# Patient Record
Sex: Female | Born: 1955
Health system: Southern US, Community
[De-identification: ages and names within clinical notes are randomized; demographics above are authoritative.]

## PROBLEM LIST (undated history)

## (undated) DIAGNOSIS — S62109A Fracture of unspecified carpal bone, unspecified wrist, initial encounter for closed fracture: Secondary | ICD-10-CM

## (undated) DIAGNOSIS — G43009 Migraine without aura, not intractable, without status migrainosus: Principal | ICD-10-CM

## (undated) DIAGNOSIS — D499 Neoplasm of unspecified behavior of unspecified site: Secondary | ICD-10-CM

## (undated) DIAGNOSIS — J45909 Unspecified asthma, uncomplicated: Secondary | ICD-10-CM

## (undated) DIAGNOSIS — K589 Irritable bowel syndrome without diarrhea: Secondary | ICD-10-CM

## (undated) DIAGNOSIS — Z8669 Personal history of other diseases of the nervous system and sense organs: Secondary | ICD-10-CM

## (undated) DIAGNOSIS — S82892A Other fracture of left lower leg, initial encounter for closed fracture: Secondary | ICD-10-CM

## (undated) DIAGNOSIS — M1990 Unspecified osteoarthritis, unspecified site: Secondary | ICD-10-CM

## (undated) DIAGNOSIS — M8580 Other specified disorders of bone density and structure, unspecified site: Secondary | ICD-10-CM

## (undated) DIAGNOSIS — S83209A Unspecified tear of unspecified meniscus, current injury, unspecified knee, initial encounter: Secondary | ICD-10-CM

## (undated) HISTORY — DX: Fracture of unspecified carpal bone, unspecified wrist, initial encounter for closed fracture: S62.109A

## (undated) HISTORY — DX: Other fracture of left lower leg, initial encounter for closed fracture: S82.892A

## (undated) HISTORY — DX: Neoplasm of unspecified behavior of unspecified site: D49.9

## (undated) HISTORY — DX: Unspecified asthma, uncomplicated: J45.909

## (undated) HISTORY — DX: Unspecified tear of unspecified meniscus, current injury, unspecified knee, initial encounter: S83.209A

## (undated) HISTORY — PX: CATARACT EXTRACTION: SUR2

## (undated) HISTORY — DX: Unspecified osteoarthritis, unspecified site: M19.90

## (undated) HISTORY — DX: Other specified disorders of bone density and structure, unspecified site: M85.80

## (undated) HISTORY — DX: Personal history of other diseases of the nervous system and sense organs: Z86.69

## (undated) HISTORY — DX: Migraine without aura, not intractable, without status migrainosus: G43.009

---

## 1985-03-31 HISTORY — PX: NOSE SURGERY: SHX723

## 1991-04-01 DIAGNOSIS — J45909 Unspecified asthma, uncomplicated: Secondary | ICD-10-CM

## 1991-04-01 HISTORY — DX: Unspecified asthma, uncomplicated: J45.909

## 1994-12-30 HISTORY — PX: DIAGNOSTIC LAPAROSCOPY: SUR761

## 1995-01-30 HISTORY — PX: VAGINAL HYSTERECTOMY: SUR661

## 1997-09-25 ENCOUNTER — Encounter: Admission: RE | Admit: 1997-09-25 | Discharge: 1997-09-25 | Payer: Self-pay | Admitting: Family Medicine

## 1998-03-01 ENCOUNTER — Encounter: Admission: RE | Admit: 1998-03-01 | Discharge: 1998-03-01 | Payer: Self-pay | Admitting: Sports Medicine

## 1998-04-20 ENCOUNTER — Ambulatory Visit (HOSPITAL_COMMUNITY): Admission: RE | Admit: 1998-04-20 | Discharge: 1998-04-20 | Payer: Self-pay | Admitting: Gastroenterology

## 1998-04-20 ENCOUNTER — Encounter: Payer: Self-pay | Admitting: Gastroenterology

## 1998-04-30 ENCOUNTER — Ambulatory Visit (HOSPITAL_COMMUNITY): Admission: RE | Admit: 1998-04-30 | Discharge: 1998-04-30 | Payer: Self-pay | Admitting: Gastroenterology

## 1998-06-04 ENCOUNTER — Encounter: Payer: Self-pay | Admitting: Emergency Medicine

## 1998-06-04 ENCOUNTER — Emergency Department (HOSPITAL_COMMUNITY): Admission: EM | Admit: 1998-06-04 | Discharge: 1998-06-04 | Payer: Self-pay | Admitting: Emergency Medicine

## 1999-04-01 HISTORY — PX: LIPOSUCTION: SHX10

## 1999-05-02 ENCOUNTER — Encounter: Admission: RE | Admit: 1999-05-02 | Discharge: 1999-05-02 | Payer: Self-pay | Admitting: Sports Medicine

## 1999-05-14 ENCOUNTER — Encounter: Admission: RE | Admit: 1999-05-14 | Discharge: 1999-05-14 | Payer: Self-pay | Admitting: Family Medicine

## 1999-05-20 ENCOUNTER — Encounter: Admission: RE | Admit: 1999-05-20 | Discharge: 1999-05-20 | Payer: Self-pay | Admitting: Family Medicine

## 1999-08-07 ENCOUNTER — Encounter: Admission: RE | Admit: 1999-08-07 | Discharge: 1999-08-07 | Payer: Self-pay | Admitting: Family Medicine

## 1999-08-12 ENCOUNTER — Encounter: Admission: RE | Admit: 1999-08-12 | Discharge: 1999-08-12 | Payer: Self-pay | Admitting: Sports Medicine

## 1999-08-29 ENCOUNTER — Encounter: Admission: RE | Admit: 1999-08-29 | Discharge: 1999-08-29 | Payer: Self-pay | Admitting: Sports Medicine

## 1999-11-18 ENCOUNTER — Ambulatory Visit (HOSPITAL_COMMUNITY): Admission: RE | Admit: 1999-11-18 | Discharge: 1999-11-18 | Payer: Self-pay | Admitting: Gastroenterology

## 2000-08-26 ENCOUNTER — Ambulatory Visit (HOSPITAL_BASED_OUTPATIENT_CLINIC_OR_DEPARTMENT_OTHER): Admission: RE | Admit: 2000-08-26 | Discharge: 2000-08-26 | Payer: Self-pay | Admitting: Plastic Surgery

## 2000-08-27 ENCOUNTER — Encounter: Admission: RE | Admit: 2000-08-27 | Discharge: 2000-08-27 | Payer: Self-pay | Admitting: Sports Medicine

## 2000-10-22 ENCOUNTER — Encounter: Admission: RE | Admit: 2000-10-22 | Discharge: 2000-10-22 | Payer: Self-pay | Admitting: Family Medicine

## 2000-11-26 ENCOUNTER — Encounter: Admission: RE | Admit: 2000-11-26 | Discharge: 2000-11-26 | Payer: Self-pay | Admitting: Sports Medicine

## 2001-11-25 ENCOUNTER — Encounter: Admission: RE | Admit: 2001-11-25 | Discharge: 2001-11-25 | Payer: Self-pay | Admitting: Sports Medicine

## 2001-12-14 ENCOUNTER — Encounter: Admission: RE | Admit: 2001-12-14 | Discharge: 2001-12-14 | Payer: Self-pay | Admitting: Sports Medicine

## 2002-03-03 ENCOUNTER — Encounter: Admission: RE | Admit: 2002-03-03 | Discharge: 2002-03-03 | Payer: Self-pay | Admitting: Family Medicine

## 2003-05-31 ENCOUNTER — Encounter: Admission: RE | Admit: 2003-05-31 | Discharge: 2003-05-31 | Payer: Self-pay | Admitting: Family Medicine

## 2003-06-29 ENCOUNTER — Encounter: Admission: RE | Admit: 2003-06-29 | Discharge: 2003-06-29 | Payer: Self-pay | Admitting: Sports Medicine

## 2003-08-30 ENCOUNTER — Ambulatory Visit (HOSPITAL_COMMUNITY): Admission: RE | Admit: 2003-08-30 | Discharge: 2003-08-30 | Payer: Self-pay | Admitting: Plastic Surgery

## 2003-08-30 ENCOUNTER — Ambulatory Visit (HOSPITAL_BASED_OUTPATIENT_CLINIC_OR_DEPARTMENT_OTHER): Admission: RE | Admit: 2003-08-30 | Discharge: 2003-08-30 | Payer: Self-pay | Admitting: Plastic Surgery

## 2003-08-30 ENCOUNTER — Encounter (INDEPENDENT_AMBULATORY_CARE_PROVIDER_SITE_OTHER): Payer: Self-pay | Admitting: Specialist

## 2004-02-05 ENCOUNTER — Ambulatory Visit: Payer: Self-pay | Admitting: Family Medicine

## 2004-03-21 ENCOUNTER — Ambulatory Visit: Payer: Self-pay | Admitting: Sports Medicine

## 2004-03-21 ENCOUNTER — Ambulatory Visit (HOSPITAL_COMMUNITY): Admission: RE | Admit: 2004-03-21 | Discharge: 2004-03-21 | Payer: Self-pay | Admitting: Sports Medicine

## 2004-10-02 ENCOUNTER — Ambulatory Visit: Payer: Self-pay | Admitting: Family Medicine

## 2004-10-10 ENCOUNTER — Ambulatory Visit: Payer: Self-pay | Admitting: Sports Medicine

## 2005-01-02 ENCOUNTER — Ambulatory Visit: Payer: Self-pay | Admitting: Family Medicine

## 2005-03-31 DIAGNOSIS — S62109A Fracture of unspecified carpal bone, unspecified wrist, initial encounter for closed fracture: Secondary | ICD-10-CM

## 2005-03-31 HISTORY — DX: Fracture of unspecified carpal bone, unspecified wrist, initial encounter for closed fracture: S62.109A

## 2005-03-31 HISTORY — PX: FRACTURE SURGERY: SHX138

## 2005-11-17 ENCOUNTER — Ambulatory Visit (HOSPITAL_COMMUNITY): Admission: RE | Admit: 2005-11-17 | Discharge: 2005-11-17 | Payer: Self-pay | Admitting: Sports Medicine

## 2006-05-28 DIAGNOSIS — G43909 Migraine, unspecified, not intractable, without status migrainosus: Secondary | ICD-10-CM | POA: Insufficient documentation

## 2006-05-28 DIAGNOSIS — J45909 Unspecified asthma, uncomplicated: Secondary | ICD-10-CM | POA: Insufficient documentation

## 2006-05-28 DIAGNOSIS — J309 Allergic rhinitis, unspecified: Secondary | ICD-10-CM | POA: Insufficient documentation

## 2006-05-28 DIAGNOSIS — K449 Diaphragmatic hernia without obstruction or gangrene: Secondary | ICD-10-CM | POA: Insufficient documentation

## 2006-07-08 ENCOUNTER — Encounter: Payer: Self-pay | Admitting: Sports Medicine

## 2006-08-14 ENCOUNTER — Ambulatory Visit (HOSPITAL_COMMUNITY): Admission: RE | Admit: 2006-08-14 | Discharge: 2006-08-14 | Payer: Self-pay | Admitting: Gastroenterology

## 2006-11-03 ENCOUNTER — Ambulatory Visit: Payer: Self-pay | Admitting: Sports Medicine

## 2006-11-03 DIAGNOSIS — M62838 Other muscle spasm: Secondary | ICD-10-CM | POA: Insufficient documentation

## 2006-11-04 ENCOUNTER — Telehealth: Payer: Self-pay | Admitting: Sports Medicine

## 2006-11-05 ENCOUNTER — Ambulatory Visit: Payer: Self-pay | Admitting: Sports Medicine

## 2006-11-05 DIAGNOSIS — T4145XA Adverse effect of unspecified anesthetic, initial encounter: Secondary | ICD-10-CM | POA: Insufficient documentation

## 2006-12-11 ENCOUNTER — Encounter: Payer: Self-pay | Admitting: Sports Medicine

## 2006-12-21 ENCOUNTER — Encounter: Payer: Self-pay | Admitting: Sports Medicine

## 2006-12-31 ENCOUNTER — Encounter: Payer: Self-pay | Admitting: Sports Medicine

## 2007-03-23 ENCOUNTER — Encounter: Payer: Self-pay | Admitting: Sports Medicine

## 2007-04-02 ENCOUNTER — Encounter: Payer: Self-pay | Admitting: Sports Medicine

## 2007-04-29 ENCOUNTER — Ambulatory Visit: Payer: Self-pay | Admitting: Sports Medicine

## 2007-04-30 ENCOUNTER — Encounter: Payer: Self-pay | Admitting: *Deleted

## 2007-05-04 ENCOUNTER — Telehealth: Payer: Self-pay | Admitting: *Deleted

## 2007-05-06 ENCOUNTER — Telehealth: Payer: Self-pay | Admitting: *Deleted

## 2007-05-07 ENCOUNTER — Encounter: Payer: Self-pay | Admitting: Sports Medicine

## 2007-05-11 ENCOUNTER — Ambulatory Visit: Payer: Self-pay | Admitting: Sports Medicine

## 2007-05-19 ENCOUNTER — Ambulatory Visit (HOSPITAL_COMMUNITY): Admission: RE | Admit: 2007-05-19 | Discharge: 2007-05-19 | Payer: Self-pay | Admitting: Orthopedic Surgery

## 2007-05-25 ENCOUNTER — Encounter: Payer: Self-pay | Admitting: Sports Medicine

## 2007-06-04 ENCOUNTER — Encounter: Payer: Self-pay | Admitting: Sports Medicine

## 2007-07-30 ENCOUNTER — Encounter: Payer: Self-pay | Admitting: Sports Medicine

## 2007-09-15 ENCOUNTER — Encounter: Payer: Self-pay | Admitting: Sports Medicine

## 2007-09-28 ENCOUNTER — Encounter: Payer: Self-pay | Admitting: Sports Medicine

## 2007-12-16 ENCOUNTER — Telehealth: Payer: Self-pay | Admitting: *Deleted

## 2008-01-21 ENCOUNTER — Encounter: Payer: Self-pay | Admitting: Sports Medicine

## 2008-04-10 ENCOUNTER — Encounter: Payer: Self-pay | Admitting: Sports Medicine

## 2008-10-30 ENCOUNTER — Ambulatory Visit: Payer: Self-pay | Admitting: Family Medicine

## 2008-10-30 ENCOUNTER — Telehealth (INDEPENDENT_AMBULATORY_CARE_PROVIDER_SITE_OTHER): Payer: Self-pay | Admitting: *Deleted

## 2008-10-30 DIAGNOSIS — J0100 Acute maxillary sinusitis, unspecified: Secondary | ICD-10-CM | POA: Insufficient documentation

## 2008-11-03 ENCOUNTER — Emergency Department (HOSPITAL_COMMUNITY): Admission: EM | Admit: 2008-11-03 | Discharge: 2008-11-03 | Payer: Self-pay | Admitting: Family Medicine

## 2009-05-16 ENCOUNTER — Ambulatory Visit: Payer: Self-pay | Admitting: Family Medicine

## 2009-05-16 ENCOUNTER — Ambulatory Visit: Payer: Self-pay | Admitting: Sports Medicine

## 2009-05-16 DIAGNOSIS — L570 Actinic keratosis: Secondary | ICD-10-CM | POA: Insufficient documentation

## 2009-05-16 DIAGNOSIS — R079 Chest pain, unspecified: Secondary | ICD-10-CM | POA: Insufficient documentation

## 2009-05-16 DIAGNOSIS — D649 Anemia, unspecified: Secondary | ICD-10-CM | POA: Insufficient documentation

## 2009-05-16 LAB — CONVERTED CEMR LAB
ALT: 12 units/L (ref 0–35)
AST: 20 units/L (ref 0–37)
Albumin: 4.4 g/dL (ref 3.5–5.2)
Alkaline Phosphatase: 50 units/L (ref 39–117)
BUN: 16 mg/dL (ref 6–23)
CO2: 24 meq/L (ref 19–32)
Calcium: 9.5 mg/dL (ref 8.4–10.5)
Chloride: 106 meq/L (ref 96–112)
Cholesterol: 224 mg/dL — ABNORMAL HIGH (ref 0–200)
Creatinine, Ser: 0.79 mg/dL (ref 0.40–1.20)
Glucose, Bld: 91 mg/dL (ref 70–99)
HCT: 38.8 % (ref 36.0–46.0)
HDL: 90 mg/dL (ref >39)
Hemoglobin: 12.4 g/dL (ref 12.0–15.0)
LDL Cholesterol: 124 mg/dL — ABNORMAL HIGH (ref 0–99)
MCHC: 32 g/dL (ref 30.0–36.0)
MCV: 92.2 fL (ref 78.0–100.0)
Platelets: 254 10*3/uL (ref 150–400)
Potassium: 4.2 meq/L (ref 3.5–5.3)
RBC: 4.21 M/uL (ref 3.87–5.11)
RDW: 13.5 % (ref 11.5–15.5)
Sodium: 143 meq/L (ref 135–145)
Total Bilirubin: 0.7 mg/dL (ref 0.3–1.2)
Total CHOL/HDL Ratio: 2.5
Total Protein: 6.7 g/dL (ref 6.0–8.3)
Triglycerides: 51 mg/dL (ref <150)
VLDL: 10 mg/dL (ref 0–40)
WBC: 4.6 10*3/uL (ref 4.0–10.5)

## 2009-05-18 ENCOUNTER — Encounter: Payer: Self-pay | Admitting: Sports Medicine

## 2009-05-18 ENCOUNTER — Telehealth: Payer: Self-pay | Admitting: Family Medicine

## 2009-05-25 ENCOUNTER — Encounter: Payer: Self-pay | Admitting: Sports Medicine

## 2009-06-06 ENCOUNTER — Ambulatory Visit: Payer: Self-pay | Admitting: Sports Medicine

## 2009-06-06 ENCOUNTER — Ambulatory Visit (HOSPITAL_COMMUNITY): Admission: RE | Admit: 2009-06-06 | Discharge: 2009-06-06 | Payer: Self-pay | Admitting: Sports Medicine

## 2010-01-21 ENCOUNTER — Ambulatory Visit: Payer: Self-pay | Admitting: Sports Medicine

## 2010-04-30 NOTE — Assessment & Plan Note (Signed)
Summary: CPE,MC   Vital Signs:  Patient profile:   55 year old female Height:      63 inches Weight:      142 pounds BP sitting:   123 / 84  Vitals Entered By: Lillia Pauls CMA (May 16, 2009 10:36 AM)  History of Present Illness: 03/09/2009 Mid chest pain - substernal after busy day not that sharp or painful but uncomfortable continues to come and go gets some SOB but this also comes at other times  Hx of septoplasty on both nostrils now feels poor airflow in RT nostril  GERD - sometimes gets breakthru will use TUMS stays on aciphex  Exercise less consistent since husband CABG she is still walking less workouts less skating  she has appt with Dr Hyacinth Meeker for Pap and breast in 2 weeks Mammogram next week Saw Dr Terri Piedra for cryotherapy and is doing blue light therapy has used Effudex on left temple  Allergies: 1)  ! Fiorinal (Butalbital-Aspirin-Caffeine)  Past History:  Past Medical History: anemia - iron loss,  colles fracture fx Lt 07/1999 colles fx RT in 2009  endometriosis hepatitits A vaccine 09/2004  hx of bad reactions to presurgical medicines with vomiting Hx of migraines - meds can trigger this lumbar injury  asthma - prob EIA with cold exposure chronic siunsitis now S/P surgery  got a nose bleed on flonase has cut back on this  Family History: 1 sisters - 53 with severe asthma 1 sister with RA and Palindromic form - 21  father -21 or so - carotid endarterectomy  hbp/ alcohol PGM lived to 23  mother died crf after ulcerative colitis age 47  Social History: non smoker;  social etoh only;  keeps dogs;  exercises with figure skating  Husband had CABG on Mar 10, 2009  Review of Systems  The patient denies weight loss, weight gain, syncope, dyspnea on exertion, prolonged cough, and abdominal pain.    Physical Exam  General:  Well-developed,well-nourished,in no acute distress; alert,appropriate and cooperative throughout  examination Head:  Normocephalic and atraumatic without obvious abnormalities. No apparent alopecia or balding. Eyes:  glasses Ears:  External ear exam shows no significant lesions or deformities.  Otoscopic examination reveals clear canals, tympanic membranes are intact bilaterally without bulging, retraction, inflammation or discharge. Hearing is grossly normal bilaterally. Nose:  upper nostrils show some limited space with swollen turbinates airflow seems down a bit Rt> LT no polyps noted Mouth:  Oral mucosa and oropharynx without lesions or exudates.  Teeth in good repair. Neck:  No deformities, masses, or tenderness noted.  some limited ROM on rotation and lat bending Chest Wall:  No deformities, masses, or tenderness noted. Lungs:  Normal respiratory effort, chest expands symmetrically. Lungs are clear to auscultation, no crackles or wheezes. Heart:  Normal rate and regular rhythm. S1 and S2 normal without gallop, murmur, click, rub or other extra sounds.  rate is 56 Abdomen:  Bowel sounds positive,abdomen soft and non-tender without masses, organomegaly or hernias noted. Msk:  shoulders, hips, knees, ankles all show full ROM there is slightly dec in IR of left hip vs RT but still wnl no swelling no instability  standing there is slight curve of low back to left normalized with bending forward Pulses:  R and L carotid,radial,femoral,dorsalis pedis and posterior tibial pulses are full and equal bilaterally Extremities:  No clubbing, cyanosis, edema, or deformity noted with normal full range of motion of all joints.     Impression & Recommendations:  Problem #  1:  PREVENTIVE HEALTH CARE (ICD-V70.0) will ck stds for her health CMET Lipid get mammogram next week get pap 2 wks  Problem # 2:  CHEST PAIN (ICD-786.50) This sounds non cardiac With Hx of recurrent and new episodes will plan to do ETT  Problem # 3:  ANEMIA (ICD-285.9) will reck CBC  Problem # 4:  RHINITIS,  ALLERGIC (ICD-477.9)  Her updated medication list for this problem includes:    Zyrtec Allergy 10 Mg Tabs (Cetirizine hcl) .Marland Kitchen... 1 tab by mouth daily    Fluticasone Propionate 50 Mcg/act Susp (Fluticasone propionate) .Marland Kitchen... 1 puff two times a day  suspect her nasal sxs are related to the allergic probs However, will have her see DR Ezzard Standing since she has had septoplasty by DR Lyman Bishop several years back I am not sure whether she is having some occulion in superior area of turbinates again or not but clearly feels worse to her  Problem # 5:  ASTHMA, UNSPECIFIED (ICD-493.90)  Her updated medication list for this problem includes:    Singulair 10 Mg Tabs (Montelukast sodium) .Marland Kitchen... 1 tab by mouth daily    Pulmicort Flexhaler 180 Mcg/act Inha (Budesonide) .Marland Kitchen... 2 puffs two times a day 1 mdi    Albuterol 90 Mcg/act Aers (Albuterol) .Marland Kitchen... 1 puff q4 as needed  no change as stable with this  Problem # 6:  HERNIA, HIATAL, NONCONGENITAL (ICD-553.3) cont on aciphex may be source of chest pain but not clearly so  Complete Medication List: 1)  Aciphex 20 Mg Tbec (Rabeprazole sodium) .... Take 1 tablet by mouth once a day 2)  Ambien 10 Mg Tabs (Zolpidem tartrate) .Marland Kitchen.. 1 by mouth @hs  prn 3)  Singulair 10 Mg Tabs (Montelukast sodium) .Marland Kitchen.. 1 tab by mouth daily 4)  Zyrtec Allergy 10 Mg Tabs (Cetirizine hcl) .Marland Kitchen.. 1 tab by mouth daily 5)  Pulmicort Flexhaler 180 Mcg/act Inha (Budesonide) .... 2 puffs two times a day 1 mdi 6)  Albuterol 90 Mcg/act Aers (Albuterol) .Marland Kitchen.. 1 puff q4 as needed 7)  Fluticasone Propionate 50 Mcg/act Susp (Fluticasone propionate) .Marland Kitchen.. 1 puff bid  Patient Instructions: 1)  appt with dr Ezzard Standing is on 05/18/09 at 1:30pm. address is [address] [phone] 2)  ETT is scheduled for 06/06/09 at 11:15am at [facility]

## 2010-04-30 NOTE — Consult Note (Signed)
Summary: [facility],Nose [facility] Th[facility] Head [facility] Neck Surgery  Ear,Nose & Throat Head & Neck Surgery   Imported By: Clydell Hakim 05/23/2009 15:55:56  _____________________________________________________________________  External Attachment:    Type:   Image     Comment:   External Document

## 2010-04-30 NOTE — Assessment & Plan Note (Signed)
Summary: BREATHING ISSUES,MC   Vital Signs:  Patient profile:   55 year old female Pulse rate:   66 / minute BP sitting:   123 / 77  (right arm)  Vitals Entered By: Rochele Pages RN (January 21, 2010 11:03 AM) CC: has been off inhalers- feels that nasal passages are "blocked"   CC:  has been off inhalers- feels that nasal passages are "blocked".  History of Present Illness: SOB: with exercise.  Ran out of inhaillers several months ago.   Was doing OK until started to cross train for ice skating this Novemeber.  Has wheezing and SOB with exercise now.  also gets sxs with vigorous exercise on equipment or with pilates  very sensitive to inhaled cigarettes, odors, chemicals and these trigger SOB as well  Current Problems (verified): 1)  Actinic Keratosis  (ICD-702.0) 2)  Chest Pain  (ICD-786.50) 3)  Encounter For Long-term Use of Other Medications  (ICD-V58.69) 4)  Anemia  (ICD-285.9) 5)  Screening For Lipoid Disorders  (ICD-V77.91) 6)  Preventive Health Care  (ICD-V70.0) 7)  Acute Maxillary Sinusitis  (ICD-461.0) 8)  Advef, Drug/med/biol Subst, Anesthesia  (ICD-995.22) 9)  Muscle Spasm, Trapezius Muscle, Right  (ICD-728.85) 10)  Rhinitis, Allergic  (ICD-477.9) 11)  Migraine, Unspec., w/o Intractable Migraine  (ICD-346.90) 12)  Hernia, Hiatal, Noncongenital  (ICD-553.3) 13)  Asthma, Unspecified  (ICD-493.90)  Current Medications (verified): 1)  Aciphex 20 Mg Tbec (Rabeprazole Sodium) .... Take 1 Tablet By Mouth Once A Day 2)  Ambien 10 Mg Tabs (Zolpidem Tartrate) .Marland Kitchen.. 1 By Mouth @hs  Prn 3)  Singulair 10 Mg  Tabs (Montelukast Sodium) .Marland Kitchen.. 1 Tab By Mouth Daily 4)  Zyrtec Allergy 10 Mg  Tabs (Cetirizine Hcl) .Marland Kitchen.. 1 Tab By Mouth Daily 5)  Qvar 40 Mcg/act Aers (Beclomethasone Dipropionate) .... 2 Puffs Inhailled Two Times A Day With Spacer 6)  Ventolin Hfa 108 (90 Base) Mcg/act Aers (Albuterol Sulfate) .Marland Kitchen.. 1-2 Puffs 15 Mins Prior To Exercise or Q4s As Needed Shortness of Breath.  Use With Spacer 7)  Fluticasone Propionate 50 Mcg/act  Susp (Fluticasone Propionate) .Marland Kitchen.. 1 Puff Bid 8)  Aerochamber Mv  Misc (Spacer/aero-Holding Chambers) .Marland Kitchen.. 1  Allergies (verified): 1)  ! Fiorinal (Butalbital-Aspirin-Caffeine)  Past History:  Past Medical History: Last updated: 05/16/2009 anemia - iron loss,  colles fracture fx Lt 07/1999 colles fx RT in 2009  endometriosis hepatitits A vaccine 09/2004  hx of bad reactions to presurgical medicines with vomiting Hx of migraines - meds can trigger this lumbar injury  asthma - prob EIA with cold exposure chronic siunsitis now S/P surgery  got a nose bleed on flonase has cut back on this  Social History: Last updated: 05/16/2009 non smoker;  social etoh only;  keeps dogs;  exercises with figure skating  Husband had CABG on Mar 10, 2009  Review of Systems  The patient denies anorexia, fever, weight loss, chest pain, syncope, and abdominal pain.    Physical Exam  General:  VS noted.  Well NAD Nose:  upper nostrils show some limited space with swollen turbinates airflow seems down a bit Rt> LT no polyps noted Lungs:  Normal respiratory effort, chest expands symmetrically. Lungs are clear to auscultation, no crackles or wheezes. Heart:  Normal rate and regular rhythm. S1 and S2 normal without gallop, murmur, click, rub or other extra sounds.    Impression & Recommendations:  Problem # 1:  ASTHMA, UNSPECIFIED (ICD-493.90) Assessment Unchanged exercise induced asthma.  Plan to resume ICS. Did not like  the pilmicort device. Plan QVAR 40 2 puffs two times a day with spacer. Also albuterol prior to exercise.  Also gave RX for new spacer and flonase nasal spray. Will follow up as needed or in 1 year.  Red flags disucssed. pt voices understanding.   Her updated medication list for this problem includes:    Singulair 10 Mg Tabs (Montelukast sodium) .Marland Kitchen... 1 tab by mouth daily    Qvar 40 Mcg/act Aers (Beclomethasone  dipropionate) .Marland Kitchen... 2 puffs inhailled two times a day with spacer    Ventolin Hfa 108 (90 Base) Mcg/act Aers (Albuterol sulfate) .Marland Kitchen... 1-2 puffs 15 mins prior to exercise or q4s as needed shortness of breath. use with spacer  Complete Medication List: 1)  Aciphex 20 Mg Tbec (Rabeprazole sodium) .... Take 1 tablet by mouth once a day 2)  Ambien 10 Mg Tabs (Zolpidem tartrate) .Marland Kitchen.. 1 by mouth @hs  prn 3)  Singulair 10 Mg Tabs (Montelukast sodium) .Marland Kitchen.. 1 tab by mouth daily 4)  Zyrtec Allergy 10 Mg Tabs (Cetirizine hcl) .Marland Kitchen.. 1 tab by mouth daily 5)  Qvar 40 Mcg/act Aers (Beclomethasone dipropionate) .... 2 puffs inhailled two times a day with spacer 6)  Ventolin Hfa 108 (90 Base) Mcg/act Aers (Albuterol sulfate) .Marland Kitchen.. 1-2 puffs 15 mins prior to exercise or q4s as needed shortness of breath. use with spacer 7)  Fluticasone Propionate 50 Mcg/act Susp (Fluticasone propionate) .Marland Kitchen.. 1 puff bid 8)  Aerochamber Mv Misc (Spacer/aero-holding chambers) .Marland Kitchen.. 1  Patient Instructions: 1)  Thank you for seeing me today. 2)  Please use the QVAR 2 puffs twice a day with a spacer. This will help prevent asthma. 3)  Use the albuterol 15 mins prior to exercise. 4)  Use these with a spacer. 5)  Try the flonase nasal spray. 6)  Follow up as needed or in 1 year. Prescriptions: FLUTICASONE PROPIONATE 50 MCG/ACT  SUSP (FLUTICASONE PROPIONATE) 1 puff bid  #1 x 11   Entered by:   Clementeen Graham MD   Authorized by:   Enid Baas MD   Signed by:   Clementeen Graham MD on 01/21/2010   Method used:   Electronically to        [facility]* (retail)       [address]       [address] Shipping/mailing       [place], [place]  [address]       Ph: [phone]       Fax: [phone]   RxID:   [id number] AEROCHAMBER MV  MISC (SPACER/AERO-HOLDING CHAMBERS) 1  #1 x 0   Entered by:   Clementeen Graham MD   Authorized by:   Enid Baas MD   Signed by:   Clementeen Graham MD on 01/21/2010   Method used:   Electronically to         [facility]* (retail)       [address]       [address] Shipping/mailing       [place], [place]  [address]       Ph: [phone]       Fax: [phone]   RxID:   [id number] VENTOLIN HFA 108 (90 BASE) MCG/ACT AERS (ALBUTEROL SULFATE) 1-2 puffs 15 mins prior to exercise or q4s as needed shortness of breath. Use with spacer  #1 x 11   Entered by:   Clementeen Graham MD   Authorized by:   Enid Baas MD  Signed by:   Clementeen Graham MD on 01/21/2010   Method used:   Electronically to        [facility]* (retail)       [address]       [address] [place] [address]       [place], [place]  [address]       Ph: [phone]       Fax: [phone]   RxID:   [id number] QVAR 40 MCG/ACT AERS (BECLOMETHASONE DIPROPIONATE) 2 puffs inhailled two times a day with spacer  #1 x 11   Entered by:   Clementeen Graham MD   Authorized by:   Enid Baas MD   Signed by:   Clementeen Graham MD on 01/21/2010   Method used:   Electronically to        [facility]* (retail)       [address]       [address] Shipping/mailing       [place], [place]  [address]       Ph: [phone]       Fax: [phone]   RxID:   [id number]    Orders Added: 1)  Est. Patient Level III [[address]]

## 2010-04-30 NOTE — Letter (Signed)
Summary: *Referral Letter  [facility]  [address]   [place], [place] [address]   Phone: [phone]  Fax: [phone]    05/16/2009 Narda Bonds, MD ENT Fax: [phone]  Dear [name]:  Thank you in advance for agreeing to see my patient:  Susan Crosby [address] [place], [place]  [address]  Phone: [phone]  Reason for Referral: Quinesha is wife of Aniela Caniglia and has been my patient for years.  At least 10 years ago Dr Lyman Bishop did a septoplasty which helped her breathing a lot.  More recently she is feeling more limitation of air flow again and wonders if there is so new occlusion. She does have long standing allergic rhinitis but I am unsure if this is the cause of symptoms based on exam.  Procedures Requested: Your evaluations and suggestions as to care.  Current Medical Problems: 1)  BACK PAIN, WITH RADIATION, UNSPEC. (ICD-724.4) 2)  ACTINIC KERATOSIS (ICD-702.0) 3)  CHEST PAIN (ICD-786.50) 4)  ENCOUNTER FOR LONG-TERM USE OF OTHER MEDICATIONS (ICD-V58.69) 5)  ANEMIA (ICD-285.9) 6)  SCREENING FOR LIPOID DISORDERS (ICD-V77.91) 7)  PREVENTIVE HEALTH CARE (ICD-V70.0) 8)  ACUTE MAXILLARY SINUSITIS (ICD-461.0) 9)  ADVEF, DRUG/MED/BIOL SUBST, ANESTHESIA (ICD-995.22) 10)  MUSCLE SPASM, TRAPEZIUS MUSCLE, RIGHT (ICD-728.85) 11)  RHINITIS, ALLERGIC (ICD-477.9) 12)  MIGRAINE, UNSPEC., W/O INTRACTABLE MIGRAINE (ICD-346.90) 13)  HERNIA, HIATAL, NONCONGENITAL (ICD-553.3) 14)  ASTHMA, UNSPECIFIED (ICD-493.90)   Current Medications: 1)  ACIPHEX 20 MG TBEC (RABEPRAZOLE SODIUM) Take 1 tablet by mouth once a day 2)  AMBIEN 10 MG TABS (ZOLPIDEM TARTRATE) 1 by mouth @hs  prn 3)  SINGULAIR 10 MG  TABS (MONTELUKAST SODIUM) 1 tab by mouth daily 4)  ZYRTEC ALLERGY 10 MG  TABS (CETIRIZINE HCL) 1 tab by mouth daily 5)  PULMICORT FLEXHALER 180 MCG/ACT  INHA (BUDESONIDE) 2 puffs two times a day 1 MDI 6)  ALBUTEROL 90 MCG/ACT  AERS (ALBUTEROL) 1 puff q4 as needed 7)   FLUTICASONE PROPIONATE 50 MCG/ACT  SUSP (FLUTICASONE PROPIONATE) 1 puff bid   Past Medical History: 1)  anemia - iron loss,  2)  colles fracture fx Lt 07/1999 3)  colles fx RT in 2009 4)  endometriosis 5)  hepatitits A vaccine 09/2004 6)  hx of bad reactions to presurgical medicines with vomiting 7)  Hx of migraines - meds can trigger this 8)  lumbar injury 9)  asthma - prob EIA with cold exposure 10)  chronic siunsitis now S/P surgery 11)  got a nose bleed on flonase 12)  has cut back on this   Thank you again for agreeing to see our patient; please contact us if you have any further questions or need additional information.  Sincerely,  Vincent Gros MD

## 2010-04-30 NOTE — Assessment & Plan Note (Signed)
Summary: ETT 11:15 APPT ATY CP,NM   History of Present Illness: ETT Sxs are atypical chest pain/ also feels SOB at times  Bruce protocol 9 mins no st t wave change excellent HRR 27 beats 1 min  Neg Adeq ETT Only AVG fitness level and est Vo2 max of 35 for athletic female  Note that she had a marked jump in HR at shift between stage 2 and stage 3 this seems to be pulmonary as she feels difficulty getting air through nose  patient advised  Allergies: 1)  ! Fiorinal (Butalbital-Aspirin-Caffeine)   Impression & Recommendations:  Problem # 1:  CHEST PAIN (ICD-786.50) see ETT  Complete Medication List: 1)  Aciphex 20 Mg Tbec (Rabeprazole sodium) .... Take 1 tablet by mouth once a day 2)  Ambien 10 Mg Tabs (Zolpidem tartrate) .Marland Kitchen.. 1 by mouth @hs  prn 3)  Singulair 10 Mg Tabs (Montelukast sodium) .Marland Kitchen.. 1 tab by mouth daily 4)  Zyrtec Allergy 10 Mg Tabs (Cetirizine hcl) .Marland Kitchen.. 1 tab by mouth daily 5)  Pulmicort Flexhaler 180 Mcg/act Inha (Budesonide) .... 2 puffs two times a day 1 mdi 6)  Albuterol 90 Mcg/act Aers (Albuterol) .Marland Kitchen.. 1 puff q4 as needed 7)  Fluticasone Propionate 50 Mcg/act Susp (Fluticasone propionate) .Marland Kitchen.. 1 puff bid

## 2010-04-30 NOTE — Progress Notes (Signed)
Summary: Lab results given to pt  ---- Converted from flag ---- ---- 05/17/2009 3:40 PM, Enid Baas MD wrote: let her know that labs are good.  cholesterol runs a little high but her HDL is 90 which lowers her risk ------------------------------  Phone Note Outgoing Call   Call placed by: Terese Door,  May 18, 2009 11:40 AM Call placed to: Patient Summary of Call: Left message for pt to return call in regards to lab results.    Follow-up for Phone Call       Follow-up by: Terese Door,  May 18, 2009 11:40 AM    Additional Follow-up for Phone Call Additional follow up Details #2::    call from pt requesting labs reports gave pt results of lipid profile, explained that total chol in the borderline level but HDL very good and offsets the slightly elevated LDL, gave pt actual numbers and ranges that she requested.  other labs normal - glucose normal  Follow-up by: Bonnie [place],  May 18, 2009 4:04 PM

## 2010-07-07 LAB — POCT I-STAT, CHEM 8
BUN: 20 mg/dL (ref 6–23)
Calcium, Ion: 1.19 mmol/L (ref 1.12–1.32)
Chloride: 106 meq/L (ref 96–112)
Creatinine, Ser: 0.9 mg/dL (ref 0.4–1.2)
Glucose, Bld: 96 mg/dL (ref 70–99)
HCT: 42 % (ref 36.0–46.0)
Hemoglobin: 14.3 g/dL (ref 12.0–15.0)
Potassium: 4.3 meq/L (ref 3.5–5.1)
Sodium: 141 meq/L (ref 135–145)
TCO2: 27 mmol/L (ref 0–100)

## 2010-08-13 NOTE — Op Note (Signed)
Susan Crosby, Susan Crosby                 ACCOUNT NO.:  [id number]   MEDICAL RECORD NO.:  [id number]          PATIENT TYPE:  AMB   LOCATION:  ENDO                         FACILITY:  [facility]   PHYSICIAN:  Bernette Redbird, M.D.   DATE OF BIRTH:  [birth year]   DATE OF PROCEDURE:  08/14/2006  DATE OF DISCHARGE:                               OPERATIVE REPORT   PROCEDURE:  Colonoscopy.   INDICATIONS FOR PROCEDURE:  This is a 55 year old female with the  history of IBS, last colonoscope 7 years ago, and a family history of  colon cancer in her uncle.   FINDINGS:  Normal exam to the terminal ileum.   PROCEDURE:  The nature, purpose, and risks of the procedure were  familiar to the patient from prior examination.  She provided written  consent.  Sedation was fentanyl 100 mcg, Versed 7 mg, and Phenergan 12.5  mg IV without clinical instability.  The Pentax pediatric video  colonoscope was readily advanced to the terminal ileum which had normal  appearance, and pullback was then performed.  The appendiceal orifice  was also identified.  The quality of prep was excellent, and it is felt  that all areas were well seen.   This was a normal examination.  No polyps, cancer, colitis, vascular  malformations, or diverticular disease were observed.  There could have  been a few scattered tics in the sigmoid region, but no discrete  diverticular orifices were seen.  Retroflexion in the rectum was  unremarkable.  No biopsies were obtained.  The patient tolerated the  procedure well, and there were no apparent complications.   IMPRESSION:  Normal screening colonoscopy.   PLAN:  Consider repeat colonoscopy in 10 years or sooner if desired by  the patient in view of the family history in the second-degree relative.           ______________________________  Bernette Redbird, M.D.     RB/MEDQ  D:  08/14/2006  T:  08/14/2006  Job:  [id number]   cc:   Royal Hawthorn B. Darrick Penna, M.D.

## 2010-08-16 NOTE — Op Note (Signed)
NAME:  Susan Crosby, Susan Crosby                           ACCOUNT NO.:  [id number]   MEDICAL RECORD NO.:  [id number]                   PATIENT TYPE:  AMB   LOCATION:  DSC                                  FACILITY:  [facility]   PHYSICIAN:  Alfredia Ferguson, M.D.               DATE OF BIRTH:  [birth year]   DATE OF PROCEDURE:  08/30/2003  DATE OF DISCHARGE:                                 OPERATIVE REPORT   PREOPERATIVE DIAGNOSES:  1. A 1 cm x 5 mm scar left frontal scalp with alopecia.  2. Pigmented nevus, right antecubital fossa, 3 mm.  3. Pigmented nevus, left upper quadrant of the abdomen, 5 mm  4. Pigmented nevus, left anterior thigh, 3 mm.  5. Pigmented nevus, right popliteal area, 3 mm.   POSTOPERATIVE DIAGNOSES:  1. A 1 cm x 5 mm scar left frontal scalp with alopecia.  2. Pigmented nevus, right antecubital fossa, 3 mm.  3. Pigmented nevus, left upper quadrant of the abdomen, 5 mm  4. Pigmented nevus, left anterior thigh, 3 mm.  5. Pigmented nevus, right popliteal area, 3 mm.   OPERATIONS PERFORMED:  1. Excision of pigmented nevus x4.  2. Scar revision, left frontal scalp.   SURGEON:  Alfredia Ferguson, M.D.   ANESTHESIA:  Xylocaine 2% 1:100,000 epinephrine.   INDICATIONS FOR SURGERY:  This is a 55 year old woman who had surgery on her  scalp a couple of years ago.  She was left with some alopecia in the area of  the scar.  She wishes to have this scar revised.  She also had multiple  pigmented nevi, which she would like to have removed.  She understands she  is trading each of these pigmented nevi for a permanent potentially  unsightly scar.  She understands that if any of them returns with positive  margins and they are malignant, then she will need further excision.   DESCRIPTION OF SURGERIES:  Skin marks were placed around all of the above  described five areas, and local anesthesia was infiltrated.  Using a 3 mm  punch, the lesion in the right antecubital area, along with the left  anterior thigh and the right popliteal area were punched out.  Each of these  lesions were closed with an interrupted 5-0 nylon suture.  The lesion in the  left upper quadrant was excised in elliptical fashion down to the level of  subcutaneous tissue.  The incision was closed with multiple interrupted 5-0  nylon sutures.  All specimens were passed off for pathology.   The scalp was prepped with Betadine and draped in sterile drapes.  An  elliptical excision of the scar was carried out.  The edges were undermined  for a distance of several millimeters in all directions.  Hemostasis was  accomplished using pressure.  The scalp edges were united with multiple  interrupted 4-0 nylon suture.   Light dressings were  applied to the lesions in the extremities and trunk.  No dressing was required in the scalp.  The patient was discharged home in  satisfactory condition.                                               Alfredia Ferguson, M.D.    WBB/MEDQ  D:  08/30/2003  T:  08/30/2003  Job:  [id number]

## 2010-08-16 NOTE — Procedures (Signed)
[place]  Patient:    PEYSON, DELAO                        MRN: [id number] Proc. Date: 11/18/99 Adm. Date:  [id number] Attending:  Rich Brave CC:         Sibyl Parr. Darrick Penna, M.D.                           Procedure Report  PROCEDURE:  Colonoscopy with biopsy.  INDICATIONS:  A 55 year old with significant recent change in bowel habits, primarily, towards diarrhea.  FINDINGS:  Normal exam to the cecum.  PROCEDURE:  The nature, purpose and risks of the procedure had been discussed with the patient and she provided written consent.  Sedation was fentanyl 100 mcg and Versed 12.5 mg IV without arrhythmias or desaturation.   The Olympus PCF 160-L adjustable tension pediatric video colonoscope was advanced to the cecum without too much difficulty, applying some external abdominal compression to help overcome looping.  The cecum was identified by visualization of the appendiceal orifice and pull-back was then performed. The terminal ileum was not entered.  The quality of the prep was excellent and it is felt that all areas were well-seen.  This was a normal examination.  No polyps, cancer, clots, vascular malformations or diverticular disease were observed.  Retroflex in the rectum was unremarkable.  Random mucosal biopsies were obtained along the length of the colon.  The patient tolerated the procedure well and there were no apparent complications.  IMPRESSION:  Normal colonoscopy.  PLAN:  Await pathology on random mucosal biopsies. DD:  11/18/99 TD:  11/18/99 Job: [id number]

## 2010-08-16 NOTE — Op Note (Signed)
. [facility]  Patient:    Susan Crosby, Susan Crosby                        MRN: [id number] Proc. Date: 08/26/00 Adm. Date:  [id number] Disc. Date: [id number] Attending:  Loura Halt Ii                           Operative Report  PREOPERATIVE DIAGNOSIS: 1. A 3 mm scaly lesion, left medial thigh. 2. A 3 mm scaly lesion, right popliteal area.  POSTOPERATIVE DIAGNOSIS: 1. A 3 mm scaly lesion, left medial thigh. 2. A 3 mm scaly lesion, right popliteal area.  OPERATION PERFORMED:  Excision of lesions x 2.  SURGEON:  Alfredia Ferguson, M.D.  ANESTHESIA:  2% Xylocaine with 1:100,000 epinephrine.  INDICATIONS FOR PROCEDURE:  The patient is a 55 year old woman with two scaly lesions which have been present and showing no sign of going away.  She wishes to have them removed.  She understands she will be trading what she has for a permanent and potentially unsightly scar.  In spite of that, the patient wishes to proceed with the surgery.  DESCRIPTION OF PROCEDURE:  Skin markers were placed around the lesion on the left medial thigh and local anesthesia was infiltrated.  Similar marks were placed around the popliteal area and infiltrated with local anesthesia.  The left medial thigh was prepped and draped in sterile fashion.  After waiting approximately 10 minutes, an elliptical excision of the lesion was carried out.  The lesion was passed off for pathology.  The wound was closed by approximating the dermis using interrupted 5-0 Vicryl suture.  The skin was united using a running 6-0 nylon suture.  Attention was directed to the right popliteal area where an identical procedure was performed.  Light dressings were applied to both areas and the patient was discharged home  in satisfactory condition. DD:  09/30/00 TD:  09/30/00 Job: [id number]

## 2011-04-06 DIAGNOSIS — S82892A Other fracture of left lower leg, initial encounter for closed fracture: Secondary | ICD-10-CM

## 2011-04-06 HISTORY — DX: Other fracture of left lower leg, initial encounter for closed fracture: S82.892A

## 2011-08-27 ENCOUNTER — Other Ambulatory Visit: Payer: Self-pay | Admitting: Sports Medicine

## 2011-09-01 ENCOUNTER — Other Ambulatory Visit: Payer: Self-pay | Admitting: *Deleted

## 2011-09-01 MED ORDER — MONTELUKAST SODIUM 10 MG PO TABS: 10.0000 mg | ORAL_TABLET | Freq: Every day | ORAL | Status: DC

## 2012-09-10 ENCOUNTER — Ambulatory Visit: Payer: Self-pay | Admitting: Obstetrics & Gynecology

## 2012-11-17 ENCOUNTER — Encounter: Payer: Self-pay | Admitting: Obstetrics & Gynecology

## 2012-11-18 ENCOUNTER — Ambulatory Visit (INDEPENDENT_AMBULATORY_CARE_PROVIDER_SITE_OTHER): Payer: 59 | Admitting: Obstetrics & Gynecology

## 2012-11-18 ENCOUNTER — Encounter: Payer: Self-pay | Admitting: Obstetrics & Gynecology

## 2012-11-18 VITALS — BP 130/84 | HR 60 | Resp 16 | Ht 63.0 in | Wt 142.2 lb

## 2012-11-18 DIAGNOSIS — Z01419 Encounter for gynecological examination (general) (routine) without abnormal findings: Secondary | ICD-10-CM

## 2012-11-18 NOTE — Progress Notes (Signed)
57 y.o. G0P0000 MarriedCaucasianF here for annual exam.  Went to [place] last summer.  Hiked a lot in [place] and in [place].  Saw Dr. Thurston Hole had x rays showing spurs in knees.  Not really ice skating very much because of the pain.  Using celebrex.  Going to hike in [place] and [place] and will hike while they are there.    No vaginal bleeding.  Saw Dr. Kevan Ny every six months.    Patient's last menstrual period was 03/31/1994.          Sexually active: no  The current method of family planning is status post hysterectomy.    Exercising: yes  walking and pilates gyrotonic Smoker:  no  Health Maintenance: Pap:  11/20/99 WNL History of abnormal Pap:  no MMG:  8/18 or 8/19 at [facility] Colonoscopy:  2013 repeat in 5 years, Dr. Warrick Parisian BMD:   08/16/10 -1.4/-0/8 TDaP:  5/13 Screening Labs: PCP, Hb today: PCP, Urine today: PCP   reports that she has never smoked. She has never used smokeless tobacco. She reports that  drinks alcohol. She reports that she does not use illicit drugs.  Past Medical History  Diagnosis Date  . Asthma 1993  . History of migraine headaches   . History of narcotic addiction 01/2007  . Wrist fracture 2007    pisiform     x3  . Arthritis     in neck    Past Surgical History  Procedure Laterality Date  . Vaginal hysterectomy  11/96    Dysmenorrhea-TVH  . Nose surgery  1987  . Diagnostic laparoscopy  10/96    Endometriosis  . Fracture surgery Left 2000    ulner/radius  . Liposuction  2001    Current Outpatient Prescriptions  Medication Sig Dispense Refill  . albuterol (VENTOLIN HFA) 108 (90 BASE) MCG/ACT inhaler Inhale 1-2 puffs into the lungs. 15 min prior to exercise of every 4 hours as needed.  Use with spacer.       . Celecoxib (CELEBREX PO) Take by mouth as needed.      . cetirizine (ZYRTEC) 10 MG tablet Take 10 mg by mouth daily.       . Cholecalciferol (VITAMIN D PO) Take 800 Int'l Units by mouth. Twice daily-gel tabs      . fluticasone (FLONASE)  50 MCG/ACT nasal spray 2 sprays by Nasal route 2 (two) times daily.        . montelukast (SINGULAIR) 10 MG tablet Take 1 tablet (10 mg total) by mouth daily.  90 tablet  3  . RABEprazole (ACIPHEX) 20 MG tablet Take 20 mg by mouth daily.        . SUMAtriptan Succinate (IMITREX PO) Take by mouth as needed.      . beclomethasone (QVAR) 40 MCG/ACT inhaler Inhale 2 puffs into the lungs 2 (two) times daily. With spacer.       Marland Kitchen Spacer/Aero-Holding Chambers (AEROCHAMBER MV) inhaler Use as instructed        No current facility-administered medications for this visit.    Family History  Problem Relation Age of Onset  . Asthma Sister     2 years younger  . Diabetes Sister     2 years younger  . Hypertension Father   . Rheum arthritis Sister     4 years younger  . Crohn's disease Maternal Aunt   . Kidney failure Mother     complications from surgery  . Colitis Mother     ROS:  Pertinent  items are noted in HPI.  Otherwise, a comprehensive ROS was negative.  Exam:   BP 130/84  Pulse 60  Resp 16  Ht 5\' 3"  (1.6 m)  Wt 142 lb 3.2 oz (64.501 kg)  BMI 25.2 kg/m2  LMP 03/31/1994  Weight change: -4lbs   Height: 5\' 3"  (160 cm)  Ht Readings from Last 3 Encounters:  11/18/12 5\' 3"  (1.6 m)  05/16/09 5\' 3"  (1.6 m)  10/30/08 5\' 3"  (1.6 m)    General appearance: alert, cooperative and appears stated age Head: Normocephalic, without obvious abnormality, atraumatic Neck: no adenopathy, supple, symmetrical, trachea midline and thyroid normal to inspection and palpation Lungs: clear to auscultation bilaterally Breasts: normal appearance, no masses or tenderness Heart: regular rate and rhythm Abdomen: soft, non-tender; bowel sounds normal; no masses,  no organomegaly Extremities: extremities normal, atraumatic, no cyanosis or edema Skin: Skin color, texture, turgor normal. No rashes or lesions Lymph nodes: Cervical, supraclavicular, and axillary nodes normal. No abnormal inguinal nodes  palpated Neurologic: Grossly normal   Pelvic: External genitalia:  no lesions              Urethra:  normal appearing urethra with no masses, tenderness or lesions              Bartholins and Skenes: normal                 Vagina: normal appearing vagina with normal color and discharge, no lesions              Cervix: absent              Pap taken: no Bimanual Exam:  Uterus:  uterus absent              Adnexa: normal adnexa and no mass, fullness, tenderness               Rectovaginal: Confirms               Anus:  normal sphincter tone, no lesions  A:  Well Woman with normal exam H/O TVH 1996 PMP, no HRT Recent issues with left knee  P:   Mammogram yearly. pap smear not indicated today.   Labs with PCP. return annually or prn  An After Visit Summary was printed and given to the patient.

## 2012-11-18 NOTE — Patient Instructions (Signed)

## 2012-12-01 ENCOUNTER — Other Ambulatory Visit: Payer: Self-pay | Admitting: Neurology

## 2013-01-11 ENCOUNTER — Encounter (HOSPITAL_COMMUNITY): Payer: Self-pay | Admitting: Emergency Medicine

## 2013-01-11 ENCOUNTER — Emergency Department (HOSPITAL_COMMUNITY)
Admission: EM | Admit: 2013-01-11 | Discharge: 2013-01-11 | Disposition: A | Discharge status: Home / Self Care | Payer: 59 | Attending: Emergency Medicine | Admitting: Emergency Medicine

## 2013-01-11 DIAGNOSIS — N390 Urinary tract infection, site not specified: Secondary | ICD-10-CM | POA: Insufficient documentation

## 2013-01-11 DIAGNOSIS — K589 Irritable bowel syndrome without diarrhea: Secondary | ICD-10-CM | POA: Insufficient documentation

## 2013-01-11 DIAGNOSIS — R110 Nausea: Secondary | ICD-10-CM | POA: Insufficient documentation

## 2013-01-11 DIAGNOSIS — Z79899 Other long term (current) drug therapy: Secondary | ICD-10-CM | POA: Insufficient documentation

## 2013-01-11 DIAGNOSIS — R1084 Generalized abdominal pain: Secondary | ICD-10-CM | POA: Insufficient documentation

## 2013-01-11 DIAGNOSIS — N3000 Acute cystitis without hematuria: Secondary | ICD-10-CM | POA: Insufficient documentation

## 2013-01-11 DIAGNOSIS — R109 Unspecified abdominal pain: Secondary | ICD-10-CM

## 2013-01-11 DIAGNOSIS — R197 Diarrhea, unspecified: Secondary | ICD-10-CM | POA: Insufficient documentation

## 2013-01-11 DIAGNOSIS — N3090 Cystitis, unspecified without hematuria: Secondary | ICD-10-CM

## 2013-01-11 DIAGNOSIS — J45909 Unspecified asthma, uncomplicated: Secondary | ICD-10-CM | POA: Insufficient documentation

## 2013-01-11 DIAGNOSIS — Z885 Allergy status to narcotic agent status: Secondary | ICD-10-CM | POA: Insufficient documentation

## 2013-01-11 DIAGNOSIS — Z8669 Personal history of other diseases of the nervous system and sense organs: Secondary | ICD-10-CM | POA: Insufficient documentation

## 2013-01-11 DIAGNOSIS — R630 Anorexia: Secondary | ICD-10-CM | POA: Insufficient documentation

## 2013-01-11 DIAGNOSIS — R509 Fever, unspecified: Secondary | ICD-10-CM | POA: Insufficient documentation

## 2013-01-11 DIAGNOSIS — M129 Arthropathy, unspecified: Secondary | ICD-10-CM | POA: Insufficient documentation

## 2013-01-11 HISTORY — DX: Irritable bowel syndrome, unspecified: K58.9

## 2013-01-11 LAB — CBC
Platelets: 185 10*3/uL (ref 150–400)
RBC: 3.84 MIL/uL — ABNORMAL LOW (ref 3.87–5.11)
WBC: 7.6 10*3/uL (ref 4.0–10.5)

## 2013-01-11 LAB — COMPREHENSIVE METABOLIC PANEL
ALT: 9 U/L (ref 0–35)
Albumin: 3.6 g/dL (ref 3.5–5.2)
Alkaline Phosphatase: 55 U/L (ref 39–117)
Calcium: 9 mg/dL (ref 8.4–10.5)
Chloride: 103 mEq/L (ref 96–112)
Creatinine, Ser: 0.79 mg/dL (ref 0.50–1.10)
GFR calc Af Amer: >90 mL/min (ref >90)
GFR calc non Af Amer: 89 mL/min — ABNORMAL LOW (ref >90)
Glucose, Bld: 93 mg/dL (ref 70–99)
Potassium: 3.9 mEq/L (ref 3.5–5.1)
Sodium: 139 mEq/L (ref 135–145)
Total Bilirubin: 1.1 mg/dL (ref 0.3–1.2)
Total Protein: 7 g/dL (ref 6.0–8.3)

## 2013-01-11 LAB — URINALYSIS, ROUTINE W REFLEX MICROSCOPIC
Glucose, UA: NEGATIVE mg/dL
Nitrite: POSITIVE — AB
Protein, ur: 30 mg/dL — AB
Specific Gravity, Urine: 1.01 (ref 1.005–1.030)
Urobilinogen, UA: 0.2 mg/dL (ref 0.0–1.0)

## 2013-01-11 LAB — URINE MICROSCOPIC-ADD ON

## 2013-01-11 MED ORDER — ONDANSETRON HCL 4 MG/2ML IJ SOLN
4.0000 mg | Freq: Once | INTRAMUSCULAR | Status: AC
  Administered 2013-01-11: 4 mg via INTRAVENOUS
  Filled 2013-01-11: qty 2

## 2013-01-11 MED ORDER — SODIUM CHLORIDE 0.9 % IV BOLUS (SEPSIS)
1000.0000 mL | Freq: Once | INTRAVENOUS | Status: AC
  Administered 2013-01-11: 1000 mL via INTRAVENOUS

## 2013-01-11 MED ORDER — ONDANSETRON 4 MG PO TBDP: 4.0000 mg | ORAL_TABLET | Freq: Three times a day (TID) | ORAL | Status: DC | PRN

## 2013-01-11 MED ORDER — CEFPODOXIME PROXETIL 200 MG PO TABS: 200.0000 mg | ORAL_TABLET | Freq: Two times a day (BID) | ORAL | Status: DC

## 2013-01-11 NOTE — ED Notes (Signed)
Pt c/o right lower abdominal pain that started Friday while hiking. Pt has IBS hx and stated that she has not been eating well the past 3 weeks d/t IBS.  Pt has been having fevers stated that she is unable to take Tylenol and Ibuprofen d/t IBS and stated that she took Celebrex. Also c/o constant nausea, denies any vomiting. Pt has been in [place] and [place] from Sept 20-Oct 11.

## 2013-01-11 NOTE — ED Provider Notes (Signed)
CSN: [id number]     Arrival date & time 01/11/13  4332 History   First MD Initiated Contact with Patient 01/11/13 [phone]     Chief Complaint  Patient presents with  . Abdominal Pain  . Nausea   (Consider location/radiation/quality/duration/timing/severity/associated sxs/prior Treatment) Patient is a 57 y.o. female presenting with abdominal pain.  Abdominal Pain Pain location:  RLQ Pain quality: aching and dull   Pain radiates to:  Does not radiate Pain severity:  Moderate Onset quality:  Gradual Duration:  3 days Timing:  Constant Progression:  Waxing and waning Chronicity:  New Context comment:  Travel to [place] without sick contacts Relieved by:  Palpation Worsened by:  Nothing tried Ineffective treatments:  None tried Associated symptoms: anorexia, chills, diarrhea, fever (101.2 max) and nausea   Associated symptoms: no chest pain, no constipation, no cough, no dysuria, no flatus, no shortness of breath, no sore throat, no vaginal bleeding, no vaginal discharge and no vomiting     Past Medical History  Diagnosis Date  . Asthma 1993  . History of migraine headaches   . Wrist fracture 2007    pisiform     x3  . Arthritis     in neck  . Ankle fracture, left 04/06/11  . IBS (irritable bowel syndrome)    Past Surgical History  Procedure Laterality Date  . Vaginal hysterectomy  11/96    Dysmenorrhea-TVH  . Nose surgery  1987  . Diagnostic laparoscopy  10/96    Endometriosis  . Fracture surgery Left 2007    ulner/radius  . Liposuction  2001   Family History  Problem Relation Age of Onset  . Asthma Sister     2 years younger  . Diabetes Sister     2 years younger  . Hypertension Father   . Rheum arthritis Sister     4 years younger  . Crohn's disease Maternal Aunt   . Kidney failure Mother     complications from surgery  . Colitis Mother    History  Substance Use Topics  . Smoking status: Never Smoker   . Smokeless tobacco: Never Used  .  Alcohol Use: Yes     Comment: occasional    OB History   Grav Para Term Preterm Abortions TAB SAB Ect Mult Living   0 0 0 0 0 0 0 0 0 0      Review of Systems  Constitutional: Positive for fever (101.2 max) and chills.  HENT: Negative for congestion, rhinorrhea and sore throat.   Eyes: Negative for photophobia and visual disturbance.  Respiratory: Negative for cough and shortness of breath.   Cardiovascular: Negative for chest pain and leg swelling.  Gastrointestinal: Positive for nausea, abdominal pain, diarrhea and anorexia. Negative for vomiting, constipation and flatus.  Endocrine: Negative for polyphagia and polyuria.  Genitourinary: Negative for dysuria, flank pain, vaginal bleeding, vaginal discharge and enuresis.  Musculoskeletal: Negative for back pain and gait problem.  Skin: Negative for color change and rash.  Neurological: Negative for dizziness, syncope, light-headedness and numbness.  Hematological: Negative for adenopathy. Does not bruise/bleed easily.  All other systems reviewed and are negative.    Allergies  Fiorinal and Other  Home Medications   Current Outpatient Rx  Name  Route  Sig  Dispense  Refill  . albuterol (VENTOLIN HFA) 108 (90 BASE) MCG/ACT inhaler   Inhalation   Inhale 1-2 puffs into the lungs. 15 min prior to exercise of every 4 hours as needed.  Use  with spacer.          . beclomethasone (QVAR) 40 MCG/ACT inhaler   Inhalation   Inhale 2 puffs into the lungs 2 (two) times daily as needed (shortness of breath). With spacer.         . celecoxib (CELEBREX) 200 MG capsule   Oral   Take 200 mg by mouth daily as needed for pain.         . cetirizine (ZYRTEC) 10 MG tablet   Oral   Take 10 mg by mouth daily.          . Cholecalciferol (VITAMIN D PO)   Oral   Take 800 Units by mouth 2 (two) times daily. Twice daily-gel tabs         . fluticasone (FLONASE) 50 MCG/ACT nasal spray   Nasal   Place 2 sprays into the nose daily.           . montelukast (SINGULAIR) 10 MG tablet   Oral   Take 1 tablet (10 mg total) by mouth daily.   90 tablet   3   . Probiotic Product (ALIGN PO)   Oral   Take 1 capsule by mouth daily.         . RABEprazole (ACIPHEX) 20 MG tablet   Oral   Take 20 mg by mouth daily.           . SUMAtriptan (IMITREX) 100 MG tablet      TAKE 1/2 TABLET BY MOUTH AT ONSET OF A HEADACHE FOUR TIMES DAILY IF NEEDED   27 tablet   0   . cefpodoxime (VANTIN) 200 MG tablet   Oral   Take 1 tablet (200 mg total) by mouth 2 (two) times daily.   14 tablet   0   . Spacer/Aero-Holding Chambers (AEROCHAMBER MV) inhaler      Use as instructed           BP 106/54  Pulse 63  Temp(Src) 98.9 F (37.2 C) (Oral)  Resp 14  SpO2 100%  LMP 03/31/1994 Physical Exam  Vitals reviewed. Constitutional: She is oriented to person, place, and time. She appears well-developed and well-nourished.  HENT:  Head: Normocephalic and atraumatic.  Right Ear: External ear normal.  Left Ear: External ear normal.  Eyes: Conjunctivae and EOM are normal. Pupils are equal, round, and reactive to light.  Neck: Normal range of motion. Neck supple.  Cardiovascular: Normal rate, regular rhythm, normal heart sounds and intact distal pulses.   Pulmonary/Chest: Effort normal and breath sounds normal.  Abdominal: Soft. Bowel sounds are normal. There is generalized tenderness.  Musculoskeletal: Normal range of motion.  Neurological: She is alert and oriented to person, place, and time.  Skin: Skin is warm and dry.    ED Course  Procedures (including critical care time) Labs Review Labs Reviewed  CBC - Abnormal; Notable for the following:    RBC 3.84 (*)    Hemoglobin 11.7 (*)    HCT 34.1 (*)    All other components within normal limits  URINALYSIS, ROUTINE W REFLEX MICROSCOPIC - Abnormal; Notable for the following:    APPearance CLOUDY (*)    Hgb urine dipstick SMALL (*)    Ketones, ur 15 (*)    Protein, ur 30 (*)     Nitrite POSITIVE (*)    Leukocytes, UA MODERATE (*)    All other components within normal limits  COMPREHENSIVE METABOLIC PANEL - Abnormal; Notable for the following:    GFR calc  non Af Amer 89 (*)    All other components within normal limits  URINE MICROSCOPIC-ADD ON - Abnormal; Notable for the following:    Bacteria, UA MANY (*)    All other components within normal limits   Imaging Review No results found.  EKG Interpretation   None       MDM   1. Cystitis   2. Urinary tract infection   3. Abdominal pain   4. Nausea   5. Diarrhea    57 y.o. female  with pertinent PMH of IBS presents with RLQ pain x 3 days with fever and diarrhea.  Pt has had nausea, no vomiting.  Pain described as present in RLQ, constant, relieve by pressure.  No ho abd surgery.  Physical exam with generalized abd tenderness.  Labs as above unremarkable, however obvious UTI on UA.  Will treat with vantin.  Patient is to return precautions for abdominal pain and urinary tract infection, voiced understanding and agreed to followup with her primary care physician.    Labs and imaging as above reviewed by myself and attending,Dr. Jeraldine Loots, with whom case was discussed.   1. Cystitis   2. Urinary tract infection   3. Abdominal pain   4. Nausea   5. Diarrhea         Noel Gerold, MD 01/11/13 1054

## 2013-01-12 NOTE — ED Provider Notes (Signed)
This patient was seen in conjunction with the resident physician, Dr. Littie Deeds. The documentation is accurate and reflects my interpretation, with the following additions: This 57 year old female sitting with ongoing abdominal pain has a non-peritoneal abdomen, with laboratory evidence of urinary tract infection.  Though there are associated GI signs, the chronicity of the issues, and the absence of distress w a non-peritoneal abdomen are reassuring for the low suspicion of concurrent acute GI pathology.  Gerhard Munch, MD 01/12/13 [phone]

## 2013-01-13 LAB — URINE CULTURE

## 2013-02-18 ENCOUNTER — Telehealth: Payer: Self-pay | Admitting: *Deleted

## 2013-02-18 NOTE — Telephone Encounter (Signed)
Called patient to offer an appt today at 3:30.

## 2013-03-04 ENCOUNTER — Ambulatory Visit: Payer: Self-pay | Admitting: Neurology

## 2013-03-09 ENCOUNTER — Encounter (INDEPENDENT_AMBULATORY_CARE_PROVIDER_SITE_OTHER): Payer: Self-pay

## 2013-03-09 ENCOUNTER — Ambulatory Visit (INDEPENDENT_AMBULATORY_CARE_PROVIDER_SITE_OTHER): Payer: 59 | Admitting: Neurology

## 2013-03-09 ENCOUNTER — Encounter: Payer: Self-pay | Admitting: Neurology

## 2013-03-09 VITALS — BP 121/75 | HR 70 | Wt 135.0 lb

## 2013-03-09 DIAGNOSIS — G43009 Migraine without aura, not intractable, without status migrainosus: Secondary | ICD-10-CM

## 2013-03-09 HISTORY — DX: Migraine without aura, not intractable, without status migrainosus: G43.009

## 2013-03-09 MED ORDER — CELECOXIB 200 MG PO CAPS: 200.0000 mg | ORAL_CAPSULE | Freq: Two times a day (BID) | ORAL | Status: DC | PRN

## 2013-03-09 MED ORDER — SUMATRIPTAN SUCCINATE 100 MG PO TABS: 100.0000 mg | ORAL_TABLET | Freq: Two times a day (BID) | ORAL | Status: DC | PRN

## 2013-03-09 NOTE — Patient Instructions (Signed)
Try taking Riboflavin 400 mg daily for the migraine.    Migraine Headache A migraine headache is an intense, throbbing pain on one or both sides of your head. A migraine can last for 30 minutes to several hours. CAUSES  The exact cause of a migraine headache is not always known. However, a migraine may be caused when nerves in the brain become irritated and release chemicals that cause inflammation. This causes pain. SYMPTOMS  Pain on one or both sides of your head.  Pulsating or throbbing pain.  Severe pain that prevents daily activities.  Pain that is aggravated by any physical activity.  Nausea, vomiting, or both.  Dizziness.  Pain with exposure to bright lights, loud noises, or activity.  General sensitivity to bright lights, loud noises, or smells. Before you get a migraine, you may get warning signs that a migraine is coming (aura). An aura may include:  Seeing flashing lights.  Seeing bright spots, halos, or zig-zag lines.  Having tunnel vision or blurred vision.  Having feelings of numbness or tingling.  Having trouble talking.  Having muscle weakness. MIGRAINE TRIGGERS  Alcohol.  Smoking.  Stress.  Menstruation.  Aged cheeses.  Foods or drinks that contain nitrates, glutamate, aspartame, or tyramine.  Lack of sleep.  Chocolate.  Caffeine.  Hunger.  Physical exertion.  Fatigue.  Medicines used to treat chest pain (nitroglycerine), birth control pills, estrogen, and some blood pressure medicines. DIAGNOSIS  A migraine headache is often diagnosed based on:  Symptoms.  Physical examination.  A CT scan or MRI of your head. TREATMENT Medicines may be given for pain and nausea. Medicines can also be given to help prevent recurrent migraines.  HOME CARE INSTRUCTIONS  Only take over-the-counter or prescription medicines for pain or discomfort as directed by your caregiver. The use of long-term narcotics is not recommended.  Lie down in a  dark, quiet room when you have a migraine.  Keep a journal to find out what may trigger your migraine headaches. For example, write down:  What you eat and drink.  How much sleep you get.  Any change to your diet or medicines.  Limit alcohol consumption.  Quit smoking if you smoke.  Get 7 to 9 hours of sleep, or as recommended by your caregiver.  Limit stress.  Keep lights dim if bright lights bother you and make your migraines worse. SEEK IMMEDIATE MEDICAL CARE IF:   Your migraine becomes severe.  You have a fever.  You have a stiff neck.  You have vision loss.  You have muscular weakness or loss of muscle control.  You start losing your balance or have trouble walking.  You feel faint or pass out.  You have severe symptoms that are different from your first symptoms. MAKE SURE YOU:   Understand these instructions.  Will watch your condition.  Will get help right away if you are not doing well or get worse. Document Released: 03/17/2005 Document Revised: 06/09/2011 Document Reviewed: 03/07/2011 [facility] Patient Information 2014 [place], [place].

## 2013-03-09 NOTE — Progress Notes (Signed)
Reason for visit: Migraine  Susan Crosby is an 57 y.o. female  History of present illness:  Susan Crosby is a 57 year old right-handed white female with a history of migraine headache. Over the last year, her headaches have become less frequent. The patient takes Imitrex if needed for the headache. The patient has had some issues with cervical spondylosis, and decreased range of movement of the neck. The patient is having some mild discomfort in the neck. The patient denies any weakness of the arms, or difficulty with pain down the arms from the neck. The patient is felt to have mild carpal tunnel syndrome. The patient has had a cervical spine x-ray done recently through Dr. Teressa Senter. The patient returns to this office for an evaluation. The patient has discovered that alcohol, certain foods such as tomatoes, and weather changes will activate her headache.  Past Medical History  Diagnosis Date  . Asthma 1993  . History of migraine headaches   . Wrist fracture 2007    pisiform     x3  . Arthritis     in neck  . Ankle fracture, left 04/06/11  . IBS (irritable bowel syndrome)   . Migraine without aura, without mention of intractable migraine without mention of status migrainosus 03/09/2013    Past Surgical History  Procedure Laterality Date  . Vaginal hysterectomy  11/96    Dysmenorrhea-TVH  . Nose surgery  1987  . Diagnostic laparoscopy  10/96    Endometriosis  . Fracture surgery Left 2007    ulner/radius  . Liposuction  2001    Family History  Problem Relation Age of Onset  . Asthma Sister     2 years younger  . Diabetes Sister     2 years younger  . Hypertension Father   . Rheum arthritis Sister     4 years younger  . Crohn's disease Maternal Aunt   . Kidney failure Mother     complications from surgery  . Colitis Mother     Social history:  reports that she has never smoked. She has never used smokeless tobacco. She reports that she drinks alcohol. She reports that  she does not use illicit drugs.    Allergies  Allergen Reactions  . Fiorinal [Butalbital-Aspirin-Caffeine] Nausea And Vomiting and Other (See Comments)    Headaches   . Other     All narcotics-nausea and vomiting    Medications:  Current Outpatient Prescriptions on File Prior to Visit  Medication Sig Dispense Refill  . albuterol (VENTOLIN HFA) 108 (90 BASE) MCG/ACT inhaler Inhale 1-2 puffs into the lungs. 15 min prior to exercise of every 4 hours as needed.  Use with spacer.       . beclomethasone (QVAR) 40 MCG/ACT inhaler Inhale 2 puffs into the lungs 2 (two) times daily as needed (shortness of breath). With spacer.      . cefpodoxime (VANTIN) 200 MG tablet Take 1 tablet (200 mg total) by mouth 2 (two) times daily.  14 tablet  0  . cetirizine (ZYRTEC) 10 MG tablet Take 10 mg by mouth daily.       . Cholecalciferol (VITAMIN D PO) Take 800 Units by mouth 2 (two) times daily. Twice daily-gel tabs      . fluticasone (FLONASE) 50 MCG/ACT nasal spray Place 2 sprays into the nose daily.       . montelukast (SINGULAIR) 10 MG tablet Take 1 tablet (10 mg total) by mouth daily.  90 tablet  3  .  ondansetron (ZOFRAN ODT) 4 MG disintegrating tablet Take 1 tablet (4 mg total) by mouth every 8 (eight) hours as needed for nausea.  5 tablet  0  . Probiotic Product (ALIGN PO) Take 1 capsule by mouth daily.      . RABEprazole (ACIPHEX) 20 MG tablet Take 20 mg by mouth daily.        Marland Kitchen Spacer/Aero-Holding Chambers (AEROCHAMBER MV) inhaler Use as instructed        No current facility-administered medications on file prior to visit.    ROS:  Out of a complete 14 system review of symptoms, the patient complains only of the following symptoms, and all other reviewed systems are negative.  Headache Neck discomfort  Blood pressure 121/75, pulse 70, weight 135 lb (61.236 kg), last menstrual period 03/31/1994.  Physical Exam  General: The patient is alert and cooperative at the time of the  examination.  Neuromuscular: Range of movement of the cervical spine lacks about 20-25 of lateral rotation bilaterally.  Skin: No significant peripheral edema is noted.   Neurologic Exam  Mental status: The patient is oriented x 3.  Cranial nerves: Facial symmetry is present. Speech is normal, no aphasia or dysarthria is noted. Extraocular movements are full. Visual fields are full.  Motor: The patient has good strength in all 4 extremities.  Sensory examination: Soft touch sensation on the face, arms, and legs is symmetric.  Coordination: The patient has good finger-nose-finger and heel-to-shin bilaterally.  Gait and station: The patient has a normal gait. Tandem gait is normal. Romberg is negative. No drift is seen.  Reflexes: Deep tendon reflexes are symmetric.   Assessment/Plan:  One. Migraine headache  2. Cervical spondylosis  The patient is doing well relatively well at this point. The patient will be given a prescription for Imitrex and for the Celebrex. The patient will go on riboflavin 400 mg daily for the migraine. The patient will followup through this office in one year. The patient may desire to see a chiropractor at some point in the future for the neck issues.  Marlan Palau MD 03/09/2013 7:52 PM  [facility] [address] [place], [place] [address]  Phone [phone] Fax [phone]

## 2013-09-12 ENCOUNTER — Ambulatory Visit: Payer: 59 | Admitting: Neurology

## 2013-10-31 ENCOUNTER — Ambulatory Visit: Payer: 59 | Admitting: Obstetrics & Gynecology

## 2013-11-28 ENCOUNTER — Encounter: Payer: Self-pay | Admitting: Obstetrics & Gynecology

## 2013-11-28 ENCOUNTER — Ambulatory Visit (INDEPENDENT_AMBULATORY_CARE_PROVIDER_SITE_OTHER): Payer: 59 | Admitting: Obstetrics & Gynecology

## 2013-11-28 ENCOUNTER — Ambulatory Visit: Payer: 59 | Admitting: Obstetrics & Gynecology

## 2013-11-28 VITALS — BP 130/80 | HR 68 | Resp 18 | Ht 63.0 in | Wt 141.0 lb

## 2013-11-28 DIAGNOSIS — Z01419 Encounter for gynecological examination (general) (routine) without abnormal findings: Secondary | ICD-10-CM

## 2013-11-28 DIAGNOSIS — Z124 Encounter for screening for malignant neoplasm of cervix: Secondary | ICD-10-CM

## 2013-11-28 MED ORDER — NITROFURANTOIN MONOHYD MACRO 100 MG PO CAPS: 100.0000 mg | ORAL_CAPSULE | Freq: Two times a day (BID) | ORAL | Status: DC

## 2013-11-28 NOTE — Progress Notes (Signed)
58 y.o. G0P0000 MarriedCaucasianF here for annual exam.  Doing well.  Did have an ER visit last fall due to a severe UTI.  Hasn't seen Dr. Inda Merlin in the past year.  Has appt with 11/30/13.  No vaginal bleeding.   Last year travelled to [place], then later to [place] and [place].  Hiked both places.  Had issues with her knee last summer and is feeling the same issues this summer.   Going to [employer] at the end of November.  Will go to [place].  She is going specifically to see polar bears.   Patient's last menstrual period was 03/31/1994.          Sexually active: No.  The current method of family planning is status post hysterectomy.    Exercising: Yes.    Pilates, walking, gyrotonis 3 x weekly Smoker:  no  Health Maintenance: Pap:  10/1999 WNL History of abnormal Pap:  no MMG: 10/2012 BIRADS2: Benign  Colonoscopy:  2013 Repeat 5-10 years  BMD:  07/2010, mild osteopenia TDaP: 07/2011 Screening Labs: PCP, Hb today: PCP, Urine today: PCP   reports that she has never smoked. She has never used smokeless tobacco. She reports that she drinks about 1.5 ounces of alcohol per week. She reports that she does not use illicit drugs.  Past Medical History  Diagnosis Date  . Asthma 1993  . History of migraine headaches   . Wrist fracture 2007    pisiform     x3  . Arthritis     in neck  . Ankle fracture, left 04/06/11  . IBS (irritable bowel syndrome)   . Migraine without aura, without mention of intractable migraine without mention of status migrainosus 03/09/2013    Past Surgical History  Procedure Laterality Date  . Vaginal hysterectomy  11/96    Dysmenorrhea-TVH  . Nose surgery  1987  . Diagnostic laparoscopy  10/96    Endometriosis  . Fracture surgery Left 2007    ulner/radius  . Liposuction  2001    Current Outpatient Prescriptions  Medication Sig Dispense Refill  . cefpodoxime (VANTIN) 200 MG tablet Take 1 tablet (200 mg total) by mouth 2 (two) times daily.  14 tablet   0  . celecoxib (CELEBREX) 200 MG capsule Take 1 capsule (200 mg total) by mouth 2 (two) times daily as needed.  180 capsule  3  . cetirizine (ZYRTEC) 10 MG tablet Take 10 mg by mouth daily.       . fluticasone (FLONASE) 50 MCG/ACT nasal spray Place 2 sprays into the nose daily.       . montelukast (SINGULAIR) 10 MG tablet Take 1 tablet (10 mg total) by mouth daily.  90 tablet  3  . ondansetron (ZOFRAN ODT) 4 MG disintegrating tablet Take 1 tablet (4 mg total) by mouth every 8 (eight) hours as needed for nausea.  5 tablet  0  . RABEprazole (ACIPHEX) 20 MG tablet Take 20 mg by mouth daily.        Marland Kitchen Spacer/Aero-Holding Chambers (AEROCHAMBER MV) inhaler Use as instructed       . SUMAtriptan (IMITREX) 100 MG tablet Take 1 tablet (100 mg total) by mouth 2 (two) times daily as needed for migraine or headache. May repeat in 2 hours if headache persists or recurs.  27 tablet  3  . albuterol (VENTOLIN HFA) 108 (90 BASE) MCG/ACT inhaler Inhale 1-2 puffs into the lungs. 15 min prior to exercise of every 4 hours as needed.  Use  with spacer.       . beclomethasone (QVAR) 40 MCG/ACT inhaler Inhale 2 puffs into the lungs 2 (two) times daily as needed (shortness of breath). With spacer.       No current facility-administered medications for this visit.    Family History  Problem Relation Age of Onset  . Asthma Sister     2 years younger  . Diabetes Sister     2 years younger  . Hypertension Father   . Rheum arthritis Sister     4 years younger  . Crohn's disease Maternal Aunt   . Kidney failure Mother     complications from surgery  . Colitis Mother     ROS:  Pertinent items are noted in HPI.  Otherwise, a comprehensive ROS was negative.  Exam:   BP 130/80  Pulse 68  Resp 18  Ht 5\' 3"  (1.6 m)  Wt 141 lb (63.957 kg)  BMI 24.98 kg/m2  LMP 03/31/1994   Height: 5\' 3"  (160 cm)  Ht Readings from Last 3 Encounters:  11/28/13 5\' 3"  (1.6 m)  11/18/12 5\' 3"  (1.6 m)  05/16/09 5\' 3"  (1.6 m)     General appearance: alert, cooperative and appears stated age Head: Normocephalic, without obvious abnormality, atraumatic Neck: no adenopathy, supple, symmetrical, trachea midline and thyroid normal to inspection and palpation Lungs: clear to auscultation bilaterally Breasts: normal appearance, no masses or tenderness Heart: regular rate and rhythm Abdomen: soft, non-tender; bowel sounds normal; no masses,  no organomegaly Extremities: extremities normal, atraumatic, no cyanosis or edema Skin: Skin color, texture, turgor normal. No rashes or lesions Lymph nodes: Cervical, supraclavicular, and axillary nodes normal. No abnormal inguinal nodes palpated Neurologic: Grossly normal   Pelvic: External genitalia:  no lesions              Urethra:  normal appearing urethra with no masses, tenderness or lesions              Bartholins and Skenes: normal                 Vagina: normal appearing vagina with normal color and discharge, no lesions              Cervix: absent              Pap taken: No. Bimanual Exam:  Uterus:  uterus absent              Adnexa: no mass, fullness, tenderness               Rectovaginal: Confirms               Anus:  normal sphincter tone, no lesions  A:  Well Woman with normal exam  H/O TVH 1996  PMP, no HRT  Recent issues with left knee Remote hx of narcotic addiction Asthma   P: Mammogram yearly.  pap smear obtained today. Labs with Dr. Inda Merlin.  Has appointment on Wednesday.  No blood work done today Rx given for macrobid 100mg  bid x 7 days return annually or prn  An After Visit Summary was printed and given to the patient.

## 2013-12-01 LAB — IPS PAP TEST WITH REFLEX TO HPV

## 2013-12-13 ENCOUNTER — Other Ambulatory Visit (HOSPITAL_BASED_OUTPATIENT_CLINIC_OR_DEPARTMENT_OTHER): Payer: Self-pay | Admitting: Orthopedic Surgery

## 2013-12-13 DIAGNOSIS — M25561 Pain in right knee: Secondary | ICD-10-CM

## 2013-12-14 ENCOUNTER — Ambulatory Visit (HOSPITAL_BASED_OUTPATIENT_CLINIC_OR_DEPARTMENT_OTHER)
Admission: RE | Admit: 2013-12-14 | Discharge: 2013-12-14 | Disposition: A | Discharge status: Home / Self Care | Payer: 59 | Source: Ambulatory Visit | Attending: Orthopedic Surgery | Admitting: Orthopedic Surgery

## 2013-12-14 DIAGNOSIS — M25569 Pain in unspecified knee: Secondary | ICD-10-CM | POA: Diagnosis present

## 2013-12-14 DIAGNOSIS — M1710 Unilateral primary osteoarthritis, unspecified knee: Secondary | ICD-10-CM | POA: Insufficient documentation

## 2013-12-14 DIAGNOSIS — M23302 Other meniscus derangements, unspecified lateral meniscus, unspecified knee: Secondary | ICD-10-CM | POA: Insufficient documentation

## 2013-12-14 DIAGNOSIS — M25561 Pain in right knee: Secondary | ICD-10-CM

## 2014-02-15 ENCOUNTER — Encounter: Payer: Self-pay | Admitting: Neurology

## 2014-02-21 ENCOUNTER — Encounter: Payer: Self-pay | Admitting: Neurology

## 2014-03-13 ENCOUNTER — Encounter: Payer: Self-pay | Admitting: Neurology

## 2014-03-13 ENCOUNTER — Ambulatory Visit (INDEPENDENT_AMBULATORY_CARE_PROVIDER_SITE_OTHER): Payer: 59 | Admitting: Neurology

## 2014-03-13 VITALS — BP 151/67 | HR 73 | Ht 63.0 in | Wt 141.6 lb

## 2014-03-13 DIAGNOSIS — G43009 Migraine without aura, not intractable, without status migrainosus: Secondary | ICD-10-CM

## 2014-03-13 MED ORDER — SUMATRIPTAN SUCCINATE 100 MG PO TABS: 100.0000 mg | ORAL_TABLET | Freq: Two times a day (BID) | ORAL | Status: DC | PRN

## 2014-03-13 NOTE — Progress Notes (Signed)
Reason for visit: Migraine headache  Susan Crosby is an 58 y.o. female  History of present illness:  Susan Crosby is a 58 year old right-handed white female with migraine headache. The patient believes that she has improved somewhat with the headache frequency. She has stopped eating tomatoes which were an activator, and she now has 2 or 3 months without any headache. She may have clusters of headache at times. Imitrex is still effective for her migraine. She is having a lot of problems with degenerative arthritis of the right knee, and she may be needing a total knee replacement in the future. The patient returns for an evaluation.  Past Medical History  Diagnosis Date  . Asthma 1993  . History of migraine headaches   . Wrist fracture 2007    pisiform     x3  . Arthritis     in neck  . Ankle fracture, left 04/06/11  . IBS (irritable bowel syndrome)   . Migraine without aura, without mention of intractable migraine without mention of status migrainosus 03/09/2013    Past Surgical History  Procedure Laterality Date  . Vaginal hysterectomy  11/96    Dysmenorrhea-TVH  . Nose surgery  1987  . Diagnostic laparoscopy  10/96    Endometriosis  . Fracture surgery Left 2007    ulner/radius  . Liposuction  2001    Family History  Problem Relation Age of Onset  . Asthma Sister     2 years younger  . Diabetes Sister     2 years younger  . Hypertension Father   . Rheum arthritis Sister     4 years younger  . Crohn's disease Maternal Aunt   . Kidney failure Mother     complications from surgery  . Colitis Mother     Social history:  reports that she has never smoked. She has never used smokeless tobacco. She reports that she drinks about 1.5 oz of alcohol per week. She reports that she does not use illicit drugs.    Allergies  Allergen Reactions  . Fiorinal [Butalbital-Aspirin-Caffeine] Nausea And Vomiting and Other (See Comments)    Headaches   . Other     All  narcotics-nausea and vomiting    Medications:  Current Outpatient Prescriptions on File Prior to Visit  Medication Sig Dispense Refill  . albuterol (VENTOLIN HFA) 108 (90 BASE) MCG/ACT inhaler Inhale 1-2 puffs into the lungs. 15 min prior to exercise of every 4 hours as needed.  Use with spacer.     . beclomethasone (QVAR) 40 MCG/ACT inhaler Inhale 2 puffs into the lungs 2 (two) times daily as needed (shortness of breath). With spacer.    . celecoxib (CELEBREX) 200 MG capsule Take 1 capsule (200 mg total) by mouth 2 (two) times daily as needed. 180 capsule 3  . cetirizine (ZYRTEC) 10 MG tablet Take 10 mg by mouth daily.     . fluticasone (FLONASE) 50 MCG/ACT nasal spray Place 2 sprays into the nose daily.     . montelukast (SINGULAIR) 10 MG tablet Take 1 tablet (10 mg total) by mouth daily. 90 tablet 3  . ondansetron (ZOFRAN ODT) 4 MG disintegrating tablet Take 1 tablet (4 mg total) by mouth every 8 (eight) hours as needed for nausea. 5 tablet 0  . RABEprazole (ACIPHEX) 20 MG tablet Take 20 mg by mouth daily.      Marland Kitchen Spacer/Aero-Holding Chambers (AEROCHAMBER MV) inhaler Use as instructed      No current facility-administered medications  on file prior to visit.    ROS:  Out of a complete 14 system review of symptoms, the patient complains only of the following symptoms, and all other reviewed systems are negative.  Ringing in the ears Cough Environmental allergies Headache Right knee pain  Blood pressure 151/67, pulse 73, height 5\' 3"  (1.6 m), weight 141 lb 9.6 oz (64.229 kg), last menstrual period 03/31/1994.  Physical Exam  General: The patient is alert and cooperative at the time of the examination.  Skin: No significant peripheral edema is noted.   Neurologic Exam  Mental status: The patient is oriented x 3.  Cranial nerves: Facial symmetry is present. Speech is normal, no aphasia or dysarthria is noted. Extraocular movements are full. Visual fields are full.  Motor: The  patient has good strength in all 4 extremities.  Sensory examination: Soft touch sensation is symmetric on the face, arms, and legs.  Coordination: The patient has good finger-nose-finger and heel-to-shin bilaterally.  Gait and station: The patient has a normal gait. Tandem gait is normal. Romberg is negative. No drift is seen.  Reflexes: Deep tendon reflexes are symmetric.   Assessment/Plan:  1. Migraine headache  The patient is doing well with the migraines at this time. We will refill the Imitrex, the patient will follow-up in one year.  Jill Alexanders MD 03/13/2014 7:51 PM  [facility] [address] [place], [place] [address]  Phone [phone] Fax [phone]

## 2014-03-13 NOTE — Patient Instructions (Signed)

## 2014-08-21 ENCOUNTER — Other Ambulatory Visit: Payer: Self-pay | Admitting: Nurse Practitioner

## 2014-08-21 ENCOUNTER — Ambulatory Visit
Admission: RE | Admit: 2014-08-21 | Discharge: 2014-08-21 | Disposition: A | Discharge status: Home / Self Care | Payer: 59 | Source: Ambulatory Visit | Attending: Nurse Practitioner | Admitting: Nurse Practitioner

## 2014-08-21 DIAGNOSIS — R059 Cough, unspecified: Secondary | ICD-10-CM

## 2014-08-21 DIAGNOSIS — R05 Cough: Secondary | ICD-10-CM

## 2014-08-27 ENCOUNTER — Emergency Department (HOSPITAL_COMMUNITY)
Admission: EM | Admit: 2014-08-27 | Discharge: 2014-08-27 | Disposition: A | Discharge status: Home / Self Care | Payer: 59 | Attending: Emergency Medicine | Admitting: Emergency Medicine

## 2014-08-27 ENCOUNTER — Encounter (HOSPITAL_COMMUNITY): Payer: Self-pay

## 2014-08-27 ENCOUNTER — Emergency Department (HOSPITAL_COMMUNITY): Payer: 59

## 2014-08-27 DIAGNOSIS — Z792 Long term (current) use of antibiotics: Secondary | ICD-10-CM | POA: Insufficient documentation

## 2014-08-27 DIAGNOSIS — Z79899 Other long term (current) drug therapy: Secondary | ICD-10-CM | POA: Insufficient documentation

## 2014-08-27 DIAGNOSIS — Z7952 Long term (current) use of systemic steroids: Secondary | ICD-10-CM | POA: Diagnosis not present

## 2014-08-27 DIAGNOSIS — Z8781 Personal history of (healed) traumatic fracture: Secondary | ICD-10-CM | POA: Insufficient documentation

## 2014-08-27 DIAGNOSIS — M47892 Other spondylosis, cervical region: Secondary | ICD-10-CM | POA: Insufficient documentation

## 2014-08-27 DIAGNOSIS — K5900 Constipation, unspecified: Secondary | ICD-10-CM | POA: Insufficient documentation

## 2014-08-27 DIAGNOSIS — Z7951 Long term (current) use of inhaled steroids: Secondary | ICD-10-CM | POA: Insufficient documentation

## 2014-08-27 DIAGNOSIS — Z8679 Personal history of other diseases of the circulatory system: Secondary | ICD-10-CM | POA: Insufficient documentation

## 2014-08-27 MED ORDER — FLEET ENEMA 7-19 GM/118ML RE ENEM
1.0000 | ENEMA | Freq: Once | RECTAL | Status: AC
  Administered 2014-08-27: 1 via RECTAL
  Filled 2014-08-27: qty 1

## 2014-08-27 MED ORDER — MILK AND MOLASSES ENEMA
1.0000 | Freq: Once | RECTAL | Status: AC
  Administered 2014-08-27: 250 mL via RECTAL
  Filled 2014-08-27: qty 250

## 2014-08-27 NOTE — Discharge Instructions (Signed)
Return to the ED with any concerns including abdominal pain, vomiting and not able to keep down liquids, decreased level of alertness/lethargy, or any other alarming symptoms

## 2014-08-27 NOTE — ED Provider Notes (Signed)
CSN: [id number]     Arrival date & time 08/27/14  0714 History   First MD Initiated Contact with Patient 08/27/14 0715     Chief Complaint  Patient presents with  . Constipation     (Consider location/radiation/quality/duration/timing/severity/associated sxs/prior Treatment) HPI  Pt presenting with c/o constipation.  She has hx of IBS and normally has 3-4  BMs per day.  She was diagnosed with pneumonia approx 6 days ago and was given rx for narcotic cough medicine.  She states that over the 6 days she has had no bowel movement.  Started on miralax 2 nights ago- took 2 doses yesterday.  Feels that there is stool present that she is not able to pass.  Some abdominal bloating.  No vomiting.  There are no other associated systemic symptoms, there are no other alleviating or modifying factors.   Past Medical History  Diagnosis Date  . Asthma 1993  . History of migraine headaches   . Wrist fracture 2007    pisiform     x3  . Arthritis     in neck  . Ankle fracture, left 04/06/11  . IBS (irritable bowel syndrome)   . Migraine without aura, without mention of intractable migraine without mention of status migrainosus 03/09/2013   Past Surgical History  Procedure Laterality Date  . Vaginal hysterectomy  11/96    Dysmenorrhea-TVH  . Nose surgery  1987  . Diagnostic laparoscopy  10/96    Endometriosis  . Fracture surgery Left 2007    ulner/radius  . Liposuction  2001   Family History  Problem Relation Age of Onset  . Asthma Sister     2 years younger  . Diabetes Sister     2 years younger  . Hypertension Father   . Rheum arthritis Sister     4 years younger  . Crohn's disease Maternal Aunt   . Kidney failure Mother     complications from surgery  . Colitis Mother    History  Substance Use Topics  . Smoking status: Never Smoker   . Smokeless tobacco: Never Used  . Alcohol Use: 1.5 oz/week    3 Standard drinks or equivalent per week     Comment: occasional    OB History    Gravida Para Term Preterm AB TAB SAB Ectopic Multiple Living   0 0 0 0 0 0 0 0 0 0      Review of Systems  ROS reviewed and all otherwise negative except for mentioned in HPI    Allergies  Fiorinal and Other  Home Medications   Prior to Admission medications   Medication Sig Start Date End Date Taking? Authorizing Provider  albuterol (VENTOLIN HFA) 108 (90 BASE) MCG/ACT inhaler Inhale 1-2 puffs into the lungs. 15 min prior to exercise of every 4 hours as needed.  Use with spacer.    Yes Historical Provider, MD  beclomethasone (QVAR) 40 MCG/ACT inhaler Inhale 2 puffs into the lungs 2 (two) times daily as needed (shortness of breath). With spacer.   Yes Historical Provider, MD  celecoxib (CELEBREX) 200 MG capsule Take 1 capsule (200 mg total) by mouth 2 (two) times daily as needed. 03/09/13  Yes Kathrynn Ducking, MD  cetirizine (ZYRTEC) 10 MG tablet Take 10 mg by mouth every morning.    Yes Historical Provider, MD  fluticasone (FLONASE) 50 MCG/ACT nasal spray Place 1 spray into the nose 2 (two) times daily.    Yes Historical Provider, MD  guaiFENesin-codeine [facility])  100-10 MG/5ML syrup Take 5 mLs by mouth 3 (three) times daily as needed for cough.   Yes Historical Provider, MD  levofloxacin (LEVAQUIN) 500 MG tablet Take 500 mg by mouth daily. For 7 days 08/21/14  Yes Historical Provider, MD  montelukast (SINGULAIR) 10 MG tablet Take 1 tablet (10 mg total) by mouth daily. Patient taking differently: Take 10 mg by mouth at bedtime.  09/01/11  Yes Stefanie Libel, MD  ondansetron (ZOFRAN ODT) 4 MG disintegrating tablet Take 1 tablet (4 mg total) by mouth every 8 (eight) hours as needed for nausea. 01/11/13  Yes Debby Freiberg, MD  saccharomyces boulardii (FLORASTOR) 250 MG capsule Take 250 mg by mouth 2 (two) times daily.   Yes Historical Provider, MD  SUMAtriptan (IMITREX) 100 MG tablet Take 1 tablet (100 mg total) by mouth 2 (two) times daily as needed for migraine or headache. May repeat in  2 hours if headache persists or recurs. Patient taking differently: Take 50 mg by mouth 2 (two) times daily as needed for migraine or headache. May repeat in 2 hours if headache persists or recurs. 03/13/14  Yes Kathrynn Ducking, MD  predniSONE (STERAPRED UNI-PAK 21 TAB) 10 MG (21) TBPK tablet Take 10-60 mg by mouth daily. Dose pack taper 08/21/14   Historical Provider, MD  Spacer/Aero-Holding Chambers (AEROCHAMBER MV) inhaler Use as instructed     Historical Provider, MD   BP 111/62 mmHg  Pulse 61  Temp(Src) 97.8 F (36.6 C) (Oral)  Resp 18  Ht 5\' 3"  (1.6 m)  Wt 134 lb (60.782 kg)  BMI 23.74 kg/m2  SpO2 100%  LMP 03/31/1994  Vitals reviewed Physical Exam  Physical Examination: General appearance - alert, well appearing, and in no distress Mental status - alert, oriented to person, place, and time Eyes - no conjunctival injection, no scleral icterus Mouth - mucous membranes moist, pharynx normal without lesions Chest - clear to auscultation, no wheezes, rales or rhonchi, symmetric air entry Heart - normal rate, regular rhythm, normal S1, S2, no murmurs, rubs, clicks or gallops Abdomen - soft, nontender, nondistended, no masses or organomegaly, nabs Extremities - peripheral pulses normal, no pedal edema, no clubbing or cyanosis Skin - normal coloration and turgor, no rashes  ED Course  Procedures (including critical care time) Labs Review Labs Reviewed - No data to display  Imaging Review No results found.   EKG Interpretation None      MDM   Final diagnoses:  Constipation, unspecified constipation type    9:05 AM pt has had some stool output after enema.  Xray shows no impaction, no obstruction.  Pt continues to feel like she is not fully evacuated and would like further treatment.  Will do milk and molasses enema.    11:55 AM pt has had some further stool output with second enema.    Benign abdominal exam, no signs of obstruction or impaction on xray.  Symptoms are  likley due to narcotic cough medication and decreased appetite due to recent illness.  Discharged with strict return precautions.  Pt agreeable with plan.  Alfonzo Beers, MD 08/29/14 1055

## 2014-08-27 NOTE — ED Notes (Signed)
Patient reports a history of IBS. Patient states she has not had a BM in a week. Patient has abdominal distention, rectal pain, lower abdominal pain, and nausea.

## 2014-08-27 NOTE — ED Notes (Signed)
Patient transported to X-ray 

## 2014-12-19 ENCOUNTER — Encounter: Payer: Self-pay | Admitting: Obstetrics & Gynecology

## 2014-12-19 ENCOUNTER — Ambulatory Visit (INDEPENDENT_AMBULATORY_CARE_PROVIDER_SITE_OTHER): Payer: 59 | Admitting: Obstetrics & Gynecology

## 2014-12-19 VITALS — BP 120/78 | HR 64 | Resp 12 | Ht 63.0 in | Wt 139.0 lb

## 2014-12-19 DIAGNOSIS — Z01419 Encounter for gynecological examination (general) (routine) without abnormal findings: Secondary | ICD-10-CM | POA: Diagnosis not present

## 2014-12-19 NOTE — Progress Notes (Signed)
59 y.o. G0P0000 MarriedCaucasianF here for annual exam.  Doing well.  No vaginal bleeding.    Saw Dr. Noemi Chapel for knee issues.  Has done a knee MRI.  Has also had injections in her knee.  Has done three injections.  Discussed my family experience at [facility] with family members.  Suggestion for second opinion given.  [place] trip.  Also went to [place] in the summer.  Has really enjoyed these trips.  Patient's last menstrual period was 03/31/1994.          Sexually active: No.  The current method of family planning is status post hysterectomy.    Exercising: Yes.    walking and gyrotonic Smoker:  no  Health Maintenance: Pap:  11/28/13 WNL History of abnormal Pap:  no MMG:  12/19/13 3D, BiRads 1-negative Colonoscopy:  2013-repeat in 5 years BMD:   5/12-mild osteopenia, -1.3 TDaP:  5/13 Screening Labs: PCP, Hb today: PCP, Urine today: PCP   reports that she has never smoked. She has never used smokeless tobacco. She reports that she drinks about 2.4 oz of alcohol per week. She reports that she does not use illicit drugs.  Past Medical History  Diagnosis Date  . Asthma 1993  . History of migraine headaches   . Wrist fracture 2007    pisiform     x3  . Arthritis     in neck  . Ankle fracture, left 04/06/11  . IBS (irritable bowel syndrome)   . Migraine without aura, without mention of intractable migraine without mention of status migrainosus 03/09/2013  . Meniscus tear     patella spurs, arthritis-having steroid injections    Past Surgical History  Procedure Laterality Date  . Vaginal hysterectomy  11/96    Dysmenorrhea-TVH  . Nose surgery  1987  . Diagnostic laparoscopy  10/96    Endometriosis  . Fracture surgery Left 2007    ulner/radius  . Liposuction  2001    Current Outpatient Prescriptions  Medication Sig Dispense Refill  . albuterol (VENTOLIN HFA) 108 (90 BASE) MCG/ACT inhaler Inhale 1-2 puffs into the lungs. 15 min prior to exercise of every 4 hours as  needed.  Use with spacer.     . beclomethasone (QVAR) 40 MCG/ACT inhaler Inhale 2 puffs into the lungs 2 (two) times daily as needed (shortness of breath). With spacer.    . celecoxib (CELEBREX) 200 MG capsule Take 1 capsule (200 mg total) by mouth 2 (two) times daily as needed. 180 capsule 3  . cetirizine (ZYRTEC) 10 MG tablet Take 10 mg by mouth every morning.     . fluticasone (FLONASE) 50 MCG/ACT nasal spray Place 1 spray into the nose 2 (two) times daily.     . montelukast (SINGULAIR) 10 MG tablet Take 1 tablet (10 mg total) by mouth daily. (Patient taking differently: Take 10 mg by mouth at bedtime. ) 90 tablet 3  . ondansetron (ZOFRAN ODT) 4 MG disintegrating tablet Take 1 tablet (4 mg total) by mouth every 8 (eight) hours as needed for nausea. 5 tablet 0  . saccharomyces boulardii (FLORASTOR) 250 MG capsule Take 250 mg by mouth daily.     Marland Kitchen Spacer/Aero-Holding Chambers (AEROCHAMBER MV) inhaler Use as instructed     . SUMAtriptan (IMITREX) 100 MG tablet Take 1 tablet (100 mg total) by mouth 2 (two) times daily as needed for migraine or headache. May repeat in 2 hours if headache persists or recurs. (Patient taking differently: Take 50 mg by mouth 2 (  two) times daily as needed for migraine or headache. May repeat in 2 hours if headache persists or recurs.) 27 tablet 3   No current facility-administered medications for this visit.    Family History  Problem Relation Age of Onset  . Asthma Sister     2 years younger  . Diabetes Sister     2 years younger  . Hypertension Father   . Rheum arthritis Sister     4 years younger  . Crohn's disease Maternal Aunt   . Kidney failure Mother     complications from surgery  . Colitis Mother     ROS:  Pertinent items are noted in HPI.  Otherwise, a comprehensive ROS was negative.  Exam:   BP: 120/78, P: 64, R: 12, Wt: 139 and Ht: 5'3"  General appearance: alert, cooperative and appears stated age Head: Normocephalic, without obvious  abnormality, atraumatic Neck: no adenopathy, supple, symmetrical, trachea midline and thyroid normal to inspection and palpation Lungs: clear to auscultation bilaterally Breasts: normal appearance, no masses or tenderness Heart: regular rate and rhythm Abdomen: soft, non-tender; bowel sounds normal; no masses,  no organomegaly Extremities: extremities normal, atraumatic, no cyanosis or edema Skin: Skin color, texture, turgor normal. No rashes or lesions Lymph nodes: Cervical, supraclavicular, and axillary nodes normal. No abnormal inguinal nodes palpated Neurologic: Grossly normal   Pelvic: External genitalia:  no lesions              Urethra:  normal appearing urethra with no masses, tenderness or lesions              Bartholins and Skenes: normal                 Vagina: normal appearing vagina with normal color and discharge, no lesions              Cervix: absent              Pap taken: No. Bimanual Exam:  Uterus:  uterus absent              Adnexa: no mass, fullness, tenderness               Rectovaginal: Confirms               Anus:  normal sphincter tone, no lesions  Chaperone was present for exam.  A:  Well Woman with normal exam  H/O [facility] 1996 for dysmenorrhea PMP, no HRT   Right knee arthritis, has seen Dr. Noemi Chapel and Dr. Maureen Ralphs Remote hx of narcotic addiction Asthma    P: Mammogram yearly Pap obtained last year.  No pap smear today. Pt will get Zostavax vaccination with PCP Labs planned for next months return annually or prn

## 2015-03-19 ENCOUNTER — Ambulatory Visit (INDEPENDENT_AMBULATORY_CARE_PROVIDER_SITE_OTHER): Payer: 59 | Admitting: Neurology

## 2015-03-19 ENCOUNTER — Encounter: Payer: Self-pay | Admitting: Neurology

## 2015-03-19 VITALS — BP 129/74 | HR 80 | Ht 63.0 in | Wt 142.5 lb

## 2015-03-19 DIAGNOSIS — G43009 Migraine without aura, not intractable, without status migrainosus: Secondary | ICD-10-CM | POA: Diagnosis not present

## 2015-03-19 MED ORDER — SUMATRIPTAN SUCCINATE 100 MG PO TABS: 50.0000 mg | ORAL_TABLET | Freq: Two times a day (BID) | ORAL | Status: DC | PRN

## 2015-03-19 NOTE — Progress Notes (Signed)
Reason for visit:  Migraine headache  Susan Crosby is an 59 y.o. female  History of present illness:   Susan Crosby is a 59 year old right-handed white female with a history of migraine headaches. Her headaches are relatively infrequent, occurring once every month or 2. The patient takes Imitrex with good improvement. Occasionally she may have to take 2 doses of 50 mg to control the headache. The patient is not on a daily medication to prevent the headache. She is having some issues with arthritis in the right knee, otherwise no significant active medical issues have been noted. The patient returns to this office for an evaluation. Whether changes are an activator for her headache.  Past Medical History  Diagnosis Date  . Asthma 1993  . History of migraine headaches   . Wrist fracture 2007    pisiform     x3  . Arthritis     in neck  . Ankle fracture, left 04/06/11  . IBS (irritable bowel syndrome)   . Migraine without aura, without mention of intractable migraine without mention of status migrainosus 03/09/2013  . Meniscus tear     patella spurs, arthritis-having steroid injections  . Osteopenia     mild    Past Surgical History  Procedure Laterality Date  . Vaginal hysterectomy  11/96    Dysmenorrhea-TVH  . Nose surgery  1987  . Diagnostic laparoscopy  10/96    Endometriosis  . Fracture surgery Left 2007    ulner/radius  . Liposuction  2001    Family History  Problem Relation Age of Onset  . Asthma Sister     2 years younger  . Diabetes Sister     2 years younger  . Hypertension Father   . Rheum arthritis Sister     4 years younger  . Crohn's disease Maternal Aunt   . Kidney failure Mother     complications from surgery  . Colitis Mother     Social history:  reports that she has never smoked. She has never used smokeless tobacco. She reports that she drinks about 2.4 oz of alcohol per week. She reports that she does not use illicit drugs.    Allergies    Allergen Reactions  . Fiorinal [Butalbital-Aspirin-Caffeine] Nausea And Vomiting and Other (See Comments)    Headaches   . Other     All narcotics-nausea and vomiting    Medications:  Prior to Admission medications   Medication Sig Start Date End Date Taking? Authorizing Provider  albuterol (VENTOLIN HFA) 108 (90 BASE) MCG/ACT inhaler Inhale 1-2 puffs into the lungs. 15 min prior to exercise of every 4 hours as needed.  Use with spacer.    Yes Historical Provider, MD  beclomethasone (QVAR) 40 MCG/ACT inhaler Inhale 2 puffs into the lungs 2 (two) times daily as needed (shortness of breath). With spacer.   Yes Historical Provider, MD  celecoxib (CELEBREX) 200 MG capsule Take 1 capsule (200 mg total) by mouth 2 (two) times daily as needed. 03/09/13  Yes Kathrynn Ducking, MD  cetirizine (ZYRTEC) 10 MG tablet Take 10 mg by mouth every morning.    Yes Historical Provider, MD  fluticasone (FLONASE) 50 MCG/ACT nasal spray Place 1 spray into the nose 2 (two) times daily.    Yes Historical Provider, MD  montelukast (SINGULAIR) 10 MG tablet Take 1 tablet (10 mg total) by mouth daily. Patient taking differently: Take 10 mg by mouth at bedtime.  09/01/11  Yes Stefanie Libel, MD  ondansetron (ZOFRAN ODT) 4 MG disintegrating tablet Take 1 tablet (4 mg total) by mouth every 8 (eight) hours as needed for nausea. 01/11/13  Yes Debby Freiberg, MD  saccharomyces boulardii (FLORASTOR) 250 MG capsule Take 250 mg by mouth daily.    Yes Historical Provider, MD  Spacer/Aero-Holding Chambers (AEROCHAMBER MV) inhaler Use as instructed    Yes Historical Provider, MD  SUMAtriptan (IMITREX) 100 MG tablet Take 1 tablet (100 mg total) by mouth 2 (two) times daily as needed for migraine or headache. May repeat in 2 hours if headache persists or recurs. Patient taking differently: Take 50 mg by mouth 2 (two) times daily as needed for migraine or headache. May repeat in 2 hours if headache persists or recurs. Pt take may take 1/2 of  pill 03/13/14  Yes Kathrynn Ducking, MD    ROS:  Out of a complete 14 system review of symptoms, the patient complains only of the following symptoms, and all other reviewed systems are negative.   Decreased activity  Walking difficulty  Headache  Anemia  Blood pressure 129/74, pulse 80, height 5\' 3"  (1.6 m), weight 142 lb 8 oz (64.638 kg), last menstrual period 03/31/1994.  Physical Exam  General: The patient is alert and cooperative at the time of the examination.  Skin: No significant peripheral edema is noted.   Neurologic Exam  Mental status: The patient is alert and oriented x 3 at the time of the examination. The patient has apparent normal recent and remote memory, with an apparently normal attention span and concentration ability.   Cranial nerves: Facial symmetry is present. Speech is normal, no aphasia or dysarthria is noted. Extraocular movements are full. Visual fields are full.  Motor: The patient has good strength in all 4 extremities.  Sensory examination: Soft touch sensation is symmetric on the face, arms, and legs.  Coordination: The patient has good finger-nose-finger and heel-to-shin bilaterally.  Gait and station: The patient has a normal gait. Tandem gait is normal. Romberg is negative. No drift is seen.  Reflexes: Deep tendon reflexes are symmetric.   Assessment/Plan:   1. Migraine headache   The patient is doing well currently. Will continue her Imitrex, a prescription will be called in. We will follow-up in one year, sooner if needed.  Jill Alexanders MD 03/19/2015 8:27 PM  [facility] [address] [place], [place] [address]  Phone [phone] Fax [phone]

## 2015-04-09 DIAGNOSIS — L570 Actinic keratosis: Secondary | ICD-10-CM | POA: Diagnosis not present

## 2015-05-04 DIAGNOSIS — R110 Nausea: Secondary | ICD-10-CM | POA: Diagnosis not present

## 2015-05-04 DIAGNOSIS — B349 Viral infection, unspecified: Secondary | ICD-10-CM | POA: Diagnosis not present

## 2015-05-04 DIAGNOSIS — L259 Unspecified contact dermatitis, unspecified cause: Secondary | ICD-10-CM | POA: Diagnosis not present

## 2015-05-04 DIAGNOSIS — L309 Dermatitis, unspecified: Secondary | ICD-10-CM | POA: Diagnosis not present

## 2015-06-04 MED FILL — FLUTICASONE PROP 50 MCG SPR: 50 | 30 days supply | Qty: 48 | Fill #0

## 2015-06-19 MED FILL — MONTELUKAST SOD 10 MG TAB: 10 | 90 days supply | Qty: 90 | Fill #1

## 2015-08-22 ENCOUNTER — Other Ambulatory Visit: Payer: Self-pay | Admitting: Neurology

## 2015-08-22 MED FILL — VENTOLIN HFA 90 MCG INHALER: 108 (90 BAS | 25 days supply | Qty: 18 | Fill #0

## 2015-08-22 MED FILL — QVAR 40 MCG ORAL INHALER: 40 | 30 days supply | Qty: 9 | Fill #0

## 2015-08-23 MED FILL — CELECOXIB 200 MG CAPSULE: 200 | 90 days supply | Qty: 180 | Fill #0

## 2015-08-29 MED FILL — FLUTICASONE PROP 50 MCG SPR: 50 | 90 days supply | Qty: 48 | Fill #1

## 2015-09-26 MED FILL — MONTELUKAST SOD 10 MG TAB: 10 | 90 days supply | Qty: 90 | Fill #0

## 2015-10-04 DIAGNOSIS — H1013 Acute atopic conjunctivitis, bilateral: Secondary | ICD-10-CM | POA: Diagnosis not present

## 2015-10-24 MED FILL — ONDANSETRON HCL 8 MG TABLET: 8 | 7 days supply | Qty: 30 | Fill #1

## 2015-10-24 MED FILL — QVAR 40 MCG ORAL INHALER: 40 | 30 days supply | Qty: 9 | Fill #1

## 2015-10-24 MED FILL — VENTOLIN HFA 90 MCG INHALER: 108 (90 BAS | 25 days supply | Qty: 18 | Fill #1

## 2015-10-25 DIAGNOSIS — M25561 Pain in right knee: Secondary | ICD-10-CM | POA: Diagnosis not present

## 2015-10-25 DIAGNOSIS — M1711 Unilateral primary osteoarthritis, right knee: Secondary | ICD-10-CM | POA: Diagnosis not present

## 2015-12-05 DIAGNOSIS — L309 Dermatitis, unspecified: Secondary | ICD-10-CM | POA: Diagnosis not present

## 2015-12-05 DIAGNOSIS — L720 Epidermal cyst: Secondary | ICD-10-CM | POA: Diagnosis not present

## 2015-12-05 DIAGNOSIS — L821 Other seborrheic keratosis: Secondary | ICD-10-CM | POA: Diagnosis not present

## 2015-12-05 DIAGNOSIS — L570 Actinic keratosis: Secondary | ICD-10-CM | POA: Diagnosis not present

## 2015-12-07 MED FILL — FLUTICASONE PROP 50 MCG SPR: 50 | 90 days supply | Qty: 48 | Fill #2

## 2015-12-12 DIAGNOSIS — H2513 Age-related nuclear cataract, bilateral: Secondary | ICD-10-CM | POA: Diagnosis not present

## 2016-01-01 MED FILL — MONTELUKAST SOD 10 MG TAB: 10 | 90 days supply | Qty: 90 | Fill #1

## 2016-01-17 DIAGNOSIS — Z23 Encounter for immunization: Secondary | ICD-10-CM | POA: Diagnosis not present

## 2016-03-05 MED FILL — FLUTICASONE PROP 50 MCG SPR: 50 | 90 days supply | Qty: 48 | Fill #1

## 2016-03-11 DIAGNOSIS — Z1231 Encounter for screening mammogram for malignant neoplasm of breast: Secondary | ICD-10-CM | POA: Diagnosis not present

## 2016-03-12 ENCOUNTER — Ambulatory Visit (INDEPENDENT_AMBULATORY_CARE_PROVIDER_SITE_OTHER): Payer: BLUE CROSS/BLUE SHIELD | Admitting: Neurology

## 2016-03-12 ENCOUNTER — Encounter: Payer: Self-pay | Admitting: Neurology

## 2016-03-12 VITALS — BP 130/71 | HR 71 | Ht 63.0 in | Wt 147.5 lb

## 2016-03-12 DIAGNOSIS — G43009 Migraine without aura, not intractable, without status migrainosus: Secondary | ICD-10-CM

## 2016-03-12 MED ORDER — SUMATRIPTAN SUCCINATE 100 MG PO TABS: 50.0000 mg | ORAL_TABLET | Freq: Two times a day (BID) | ORAL | 3 refills | Status: DC | PRN

## 2016-03-12 MED ORDER — ONDANSETRON 4 MG PO TBDP: 4.0000 mg | ORAL_TABLET | Freq: Three times a day (TID) | ORAL | 2 refills | Status: DC | PRN

## 2016-03-12 MED FILL — ONDANSETRON ODT 4 MG TABLET: 4 | 10 days supply | Qty: 30 | Fill #0

## 2016-03-12 MED FILL — SUMATRIPTAN SUCC 100 MG TAB: 100 | 30 days supply | Qty: 18 | Fill #0

## 2016-03-12 NOTE — Progress Notes (Signed)
Reason for visit: Migraine headache  Susan Crosby is an 60 y.o. female  History of present illness:  Susan Crosby is a 60 year old right-handed white female with a history of migraine headaches. The headaches are not intractable, the patient may have one or 2 headaches a month. The patient is getting good benefit with Imitrex taking 50 mg at onset of the headache. The patient occasionally may have some nausea with the headache and she will take Zofran. She has not reported any other significant medical issues that have come up since last seen. She returns for an evaluation.  Past Medical History:  Diagnosis Date  . Ankle fracture, left 04/06/11  . Arthritis    in neck  . Asthma 1993  . History of migraine headaches   . IBS (irritable bowel syndrome)   . Meniscus tear    patella spurs, arthritis-having steroid injections  . Migraine without aura, without mention of intractable migraine without mention of status migrainosus 03/09/2013  . Osteopenia    mild  . Wrist fracture 2007   pisiform     x3    Past Surgical History:  Procedure Laterality Date  . DIAGNOSTIC LAPAROSCOPY  10/96   Endometriosis  . FRACTURE SURGERY Left 2007   ulner/radius  . LIPOSUCTION  2001  . NOSE SURGERY  1987  . VAGINAL HYSTERECTOMY  11/96   Dysmenorrhea-TVH    Family History  Problem Relation Age of Onset  . Asthma Sister     2 years younger  . Diabetes Sister     2 years younger  . Hypertension Father   . Rheum arthritis Sister     4 years younger  . Kidney failure Mother     complications from surgery  . Colitis Mother   . Crohn's disease Maternal Aunt     Social history:  reports that she has never smoked. She has never used smokeless tobacco. She reports that she drinks about 2.4 oz of alcohol per week . She reports that she does not use drugs.    Allergies  Allergen Reactions  . Fiorinal [Butalbital-Aspirin-Caffeine] Nausea And Vomiting and Other (See Comments)    Headaches   .  Other     All narcotics-nausea and vomiting    Medications:  Prior to Admission medications   Medication Sig Start Date End Date Taking? Authorizing Provider  albuterol (VENTOLIN HFA) 108 (90 BASE) MCG/ACT inhaler Inhale 1-2 puffs into the lungs. 15 min prior to exercise of every 4 hours as needed.  Use with spacer.    Yes Historical Provider, MD  beclomethasone (QVAR) 40 MCG/ACT inhaler Inhale 2 puffs into the lungs 2 (two) times daily as needed (shortness of breath). With spacer.   Yes Historical Provider, MD  CELEBREX 200 MG capsule TAKE 1 CAPSULE BY MOUTH 2 TIMES DAILY AS NEEDED. 08/23/15  Yes Kathrynn Ducking, MD  cetirizine (ZYRTEC) 10 MG tablet Take 10 mg by mouth every morning.    Yes Historical Provider, MD  fluticasone (FLONASE) 50 MCG/ACT nasal spray Place 1 spray into the nose 2 (two) times daily.    Yes Historical Provider, MD  montelukast (SINGULAIR) 10 MG tablet Take 1 tablet (10 mg total) by mouth daily. Patient taking differently: Take 10 mg by mouth at bedtime.  09/01/11  Yes Stefanie Libel, MD  ondansetron (ZOFRAN ODT) 4 MG disintegrating tablet Take 1 tablet (4 mg total) by mouth every 8 (eight) hours as needed for nausea. 01/11/13  Yes Debby Freiberg, MD  saccharomyces boulardii (FLORASTOR) 250 MG capsule Take 250 mg by mouth daily.    Yes Historical Provider, MD  Spacer/Aero-Holding Chambers (AEROCHAMBER MV) inhaler Use as instructed    Yes Historical Provider, MD  SUMAtriptan (IMITREX) 100 MG tablet Take 0.5 tablets (50 mg total) by mouth 2 (two) times daily as needed for migraine or headache. May repeat in 2 hours if headache persists or recurs. Pt take may take 1/2 of pill 03/19/15  Yes Kathrynn Ducking, MD    ROS:  Out of a complete 14 system review of symptoms, the patient complains only of the following symptoms, and all other reviewed systems are negative.  Hearing loss, ringing in the ears Leg swelling Diarrhea, irritable bowel syndrome Right knee  arthritis Headache  Blood pressure 130/71, pulse 71, height 5\' 3"  (1.6 m), weight 147 lb 8 oz (66.9 kg), last menstrual period 03/31/1994.  Physical Exam  General: The patient is alert and cooperative at the time of the examination.  Skin: No significant peripheral edema is noted.   Neurologic Exam  Mental status: The patient is alert and oriented x 3 at the time of the examination. The patient has apparent normal recent and remote memory, with an apparently normal attention span and concentration ability.   Cranial nerves: Facial symmetry is present. Speech is normal, no aphasia or dysarthria is noted. Extraocular movements are full. Visual fields are full.  Motor: The patient has good strength in all 4 extremities.  Sensory examination: Soft touch sensation is symmetric on the face, arms, and legs.  Coordination: The patient has good finger-nose-finger and heel-to-shin bilaterally.  Gait and station: The patient has a normal gait. Tandem gait is normal. Romberg is negative. No drift is seen.  Reflexes: Deep tendon reflexes are symmetric.   Assessment/Plan:  1. Migraine headache  The patient was given a prescription for Imitrex and for Zofran. She seems to be done relatively well, she will follow-up in one year, sooner if needed.  Jill Alexanders MD 03/12/2016 2:57 PM  [facility] [address] [place], [place] [address]  Phone [phone] Fax [phone]

## 2016-03-13 ENCOUNTER — Encounter: Payer: Self-pay | Admitting: Obstetrics & Gynecology

## 2016-03-31 DIAGNOSIS — D499 Neoplasm of unspecified behavior of unspecified site: Secondary | ICD-10-CM

## 2016-03-31 HISTORY — DX: Neoplasm of unspecified behavior of unspecified site: D49.9

## 2016-04-02 DIAGNOSIS — J309 Allergic rhinitis, unspecified: Secondary | ICD-10-CM | POA: Diagnosis not present

## 2016-04-02 DIAGNOSIS — M179 Osteoarthritis of knee, unspecified: Secondary | ICD-10-CM | POA: Diagnosis not present

## 2016-04-02 DIAGNOSIS — A09 Infectious gastroenteritis and colitis, unspecified: Secondary | ICD-10-CM | POA: Diagnosis not present

## 2016-04-02 DIAGNOSIS — Z79899 Other long term (current) drug therapy: Secondary | ICD-10-CM | POA: Diagnosis not present

## 2016-04-02 DIAGNOSIS — E559 Vitamin D deficiency, unspecified: Secondary | ICD-10-CM | POA: Diagnosis not present

## 2016-04-02 DIAGNOSIS — Z0001 Encounter for general adult medical examination with abnormal findings: Secondary | ICD-10-CM | POA: Diagnosis not present

## 2016-04-02 DIAGNOSIS — J310 Chronic rhinitis: Secondary | ICD-10-CM | POA: Diagnosis not present

## 2016-04-02 DIAGNOSIS — J45909 Unspecified asthma, uncomplicated: Secondary | ICD-10-CM | POA: Diagnosis not present

## 2016-04-02 DIAGNOSIS — L570 Actinic keratosis: Secondary | ICD-10-CM | POA: Diagnosis not present

## 2016-04-02 MED FILL — CIPROFLOXACIN HCL 500 MG TA: 500 | 14 days supply | Qty: 28 | Fill #0

## 2016-04-02 MED FILL — FLUCONAZOLE 150 MG TABLET: 150 | 10 days supply | Qty: 10 | Fill #0

## 2016-04-02 MED FILL — FLUOROURACIL 5% CREAM: 5 | 20 days supply | Qty: 40 | Fill #0

## 2016-04-07 MED FILL — MONTELUKAST SOD 10 MG TAB: 10 | 90 days supply | Qty: 90 | Fill #2

## 2016-04-18 ENCOUNTER — Ambulatory Visit (INDEPENDENT_AMBULATORY_CARE_PROVIDER_SITE_OTHER): Payer: BLUE CROSS/BLUE SHIELD | Admitting: Obstetrics & Gynecology

## 2016-04-18 ENCOUNTER — Encounter: Payer: Self-pay | Admitting: Obstetrics & Gynecology

## 2016-04-18 VITALS — BP 120/82 | HR 70 | Resp 16 | Ht 63.0 in | Wt 150.0 lb

## 2016-04-18 DIAGNOSIS — Z01419 Encounter for gynecological examination (general) (routine) without abnormal findings: Secondary | ICD-10-CM

## 2016-04-18 DIAGNOSIS — J310 Chronic rhinitis: Secondary | ICD-10-CM | POA: Diagnosis not present

## 2016-04-18 DIAGNOSIS — Z124 Encounter for screening for malignant neoplasm of cervix: Secondary | ICD-10-CM

## 2016-04-18 NOTE — Patient Instructions (Signed)
Please do a bone density with your mammogram in December.

## 2016-04-18 NOTE — Addendum Note (Signed)
Addended by: Megan Salon on: 04/18/2016 04:28 PM   Modules accepted: Orders

## 2016-04-18 NOTE — Progress Notes (Signed)
61 y.o. G0P0000 MarriedCaucasianF here for annual exam.  Doing well.  Being treated for SCCs on her face.  Seeing Dr. Wilhemina Bonito.  Doing Effudex and had a lot of erythema and skip peeling today.  Embarrassed about her face today.    Recently treated for yeast in her nose.  Is going to proceed with allergy testing.  Dr. Inda Merlin is going to refer her for this.  Headaches are under good control.  She sees Dr. Jannifer Franklin yearly.  No vaginal bleeding.  PCP:  Josetta Huddle, MD.  Blood work was just done two weeks ago.     Patient's last menstrual period was 03/31/1994.          Sexually active: No.  The current method of family planning is status post hysterectomy.    Exercising: Yes.    pilates, walking Smoker:  no  Health Maintenance: Pap:  11/28/13 Neg  History of abnormal Pap:  no MMG:  03/11/16 BIRADS2:Benign  Colonoscopy:  2013 f/u 5 years  BMD:   08/16/10 Osteopenia  TDaP:  07/2011  Pneumonia vaccine(s):  No Zostavax: 01/2015 with PCP Hep C testing: will have this done with Dr. Inda Merlin Screening Labs: PCP, Urine today: PCP   reports that she has never smoked. She has never used smokeless tobacco. She reports that she drinks about 2.4 oz of alcohol per week . She reports that she does not use drugs.  Past Medical History:  Diagnosis Date  . Ankle fracture, left 04/06/11  . Arthritis    in neck  . Asthma 1993  . History of migraine headaches   . IBS (irritable bowel syndrome)   . Meniscus tear    patella spurs, arthritis-having steroid injections  . Migraine without aura, without mention of intractable migraine without mention of status migrainosus 03/09/2013  . Osteopenia    mild  . Precancerous lesion 03/2016  . Wrist fracture 2007   pisiform     x3    Past Surgical History:  Procedure Laterality Date  . DIAGNOSTIC LAPAROSCOPY  10/96   Endometriosis  . FRACTURE SURGERY Left 2007   ulner/radius  . LIPOSUCTION  2001  . NOSE SURGERY  1987  . VAGINAL HYSTERECTOMY  11/96    Dysmenorrhea-TVH    Current Outpatient Prescriptions  Medication Sig Dispense Refill  . albuterol (VENTOLIN HFA) 108 (90 BASE) MCG/ACT inhaler Inhale 1-2 puffs into the lungs. 15 min prior to exercise of every 4 hours as needed.  Use with spacer.     . beclomethasone (QVAR) 40 MCG/ACT inhaler Inhale 2 puffs into the lungs 2 (two) times daily as needed (shortness of breath). With spacer.    . CELEBREX 200 MG capsule TAKE 1 CAPSULE BY MOUTH 2 TIMES DAILY AS NEEDED. 180 capsule 0  . cetirizine (ZYRTEC) 10 MG tablet Take 10 mg by mouth every morning.     . montelukast (SINGULAIR) 10 MG tablet Take 1 tablet (10 mg total) by mouth daily. (Patient taking differently: Take 10 mg by mouth at bedtime. ) 90 tablet 3  . ondansetron (ZOFRAN ODT) 4 MG disintegrating tablet Take 1 tablet (4 mg total) by mouth every 8 (eight) hours as needed for nausea. 30 tablet 2  . saccharomyces boulardii (FLORASTOR) 250 MG capsule Take 250 mg by mouth daily.     Marland Kitchen Spacer/Aero-Holding Chambers (AEROCHAMBER MV) inhaler Use as instructed     . SUMAtriptan (IMITREX) 100 MG tablet Take 0.5 tablets (50 mg total) by mouth 2 (two) times daily as needed  for migraine or headache. May repeat in 2 hours if headache persists or recurs. Pt take may take 1/2 of pill 27 tablet 3  . fluorouracil (EFUDEX) 5 % cream daily.  0  . fluticasone (FLONASE) 50 MCG/ACT nasal spray Place 1 spray into the nose 2 (two) times daily.      No current facility-administered medications for this visit.     Family History  Problem Relation Age of Onset  . Asthma Sister     2 years younger  . Diabetes Sister     2 years younger  . Hypertension Father   . Rheum arthritis Sister     4 years younger  . Kidney failure Mother     complications from surgery  . Colitis Mother   . Crohn's disease Maternal Aunt     ROS:  Pertinent items are noted in HPI.  Otherwise, a comprehensive ROS was negative.  Exam:   BP 120/82 (BP Location: Right Arm, Patient  Position: Sitting, Cuff Size: Normal)   Pulse 70   Resp 16   Ht 5\' 3"  (1.6 m)   Wt 150 lb (68 kg)   LMP 03/31/1994   BMI 26.57 kg/m    Height: 5\' 3"  (160 cm)  Ht Readings from Last 3 Encounters:  04/18/16 5\' 3"  (1.6 m)  03/12/16 5\' 3"  (1.6 m)  03/19/15 5\' 3"  (1.6 m)    General appearance: alert, cooperative and appears stated age Head: Normocephalic, without obvious abnormality, atraumatic Neck: no adenopathy, supple, symmetrical, trachea midline and thyroid normal to inspection and palpation Lungs: clear to auscultation bilaterally Breasts: normal appearance, no masses or tenderness Heart: regular rate and rhythm Abdomen: soft, non-tender; bowel sounds normal; no masses,  no organomegaly Extremities: extremities normal, atraumatic, no cyanosis or edema Skin: Skin color, texture, turgor normal. No rashes or lesions Lymph nodes: Cervical, supraclavicular, and axillary nodes normal. No abnormal inguinal nodes palpated Neurologic: Grossly normal  Pelvic: External genitalia:  no lesions              Urethra:  normal appearing urethra with no masses, tenderness or lesions              Bartholins and Skenes: normal                 Vagina: normal appearing vagina with normal color and discharge, no lesions              Cervix: absent              Pap taken: No Bimanual Exam:  Uterus:  uterus absent              Adnexa: normal adnexa and no mass, fullness, tenderness               Rectovaginal: Confirms               Anus:  normal sphincter tone, no lesions  Chaperone was present for exam.  A:  Well Woman with normal exam  H/O [facility] 1996 for dysmenorrhea PMP, no HRT   Right knee arthritis, has seen Dr. Noemi Chapel and Dr. Maureen Ralphs Remote hx of narcotic addiction Asthma and allergy issues  P:  Mammogram yearly Pap 2015.  We have discussed repeating this next year. Labs/vaccines with PCP.  She will double check about having Hep C testing done return annually or prn

## 2016-05-01 MED FILL — ATOVAQUONE-PROGUANIL 250-10: 250-100 | 30 days supply | Qty: 30 | Fill #0

## 2016-05-06 DIAGNOSIS — J3489 Other specified disorders of nose and nasal sinuses: Secondary | ICD-10-CM | POA: Diagnosis not present

## 2016-05-06 DIAGNOSIS — J343 Hypertrophy of nasal turbinates: Secondary | ICD-10-CM | POA: Diagnosis not present

## 2016-05-06 DIAGNOSIS — J3089 Other allergic rhinitis: Secondary | ICD-10-CM | POA: Diagnosis not present

## 2016-05-06 MED FILL — MUPIROCIN 2% OINTMENT: 2 | 14 days supply | Qty: 22 | Fill #0

## 2016-05-14 DIAGNOSIS — J301 Allergic rhinitis due to pollen: Secondary | ICD-10-CM | POA: Diagnosis not present

## 2016-05-14 DIAGNOSIS — J3089 Other allergic rhinitis: Secondary | ICD-10-CM | POA: Diagnosis not present

## 2016-05-14 DIAGNOSIS — J4520 Mild intermittent asthma, uncomplicated: Secondary | ICD-10-CM | POA: Diagnosis not present

## 2016-05-14 DIAGNOSIS — T781XXA Other adverse food reactions, not elsewhere classified, initial encounter: Secondary | ICD-10-CM | POA: Diagnosis not present

## 2016-05-14 MED FILL — LEVOCETIRIZINE 5 MG TABLET: 5 | 30 days supply | Qty: 30 | Fill #0

## 2016-05-14 MED FILL — AZELASTINE HCL 137 MCG SPRY: 0.1 | 13 days supply | Qty: 30 | Fill #0

## 2016-06-02 DIAGNOSIS — J3089 Other allergic rhinitis: Secondary | ICD-10-CM | POA: Diagnosis not present

## 2016-06-02 DIAGNOSIS — J3489 Other specified disorders of nose and nasal sinuses: Secondary | ICD-10-CM | POA: Diagnosis not present

## 2016-06-11 MED FILL — AZELASTINE HCL 137 MCG SPRY: 0.1 | 13 days supply | Qty: 30 | Fill #1

## 2016-06-12 DIAGNOSIS — M1711 Unilateral primary osteoarthritis, right knee: Secondary | ICD-10-CM | POA: Diagnosis not present

## 2016-07-09 DIAGNOSIS — L720 Epidermal cyst: Secondary | ICD-10-CM | POA: Diagnosis not present

## 2016-07-09 DIAGNOSIS — L218 Other seborrheic dermatitis: Secondary | ICD-10-CM | POA: Diagnosis not present

## 2016-07-09 DIAGNOSIS — L570 Actinic keratosis: Secondary | ICD-10-CM | POA: Diagnosis not present

## 2016-07-24 MED FILL — LEVOCETIRIZINE 5 MG TABLET: 5 | 30 days supply | Qty: 30 | Fill #1

## 2016-07-24 MED FILL — AMOXICILLIN 875 MG TABLET: 875 | 5 days supply | Qty: 10 | Fill #0

## 2016-07-24 MED FILL — CHLORHEXIDINE 0.12% RINSE: 0.12 | 17 days supply | Qty: 473 | Fill #0

## 2016-07-29 MED FILL — AZELASTINE HCL 137 MCG SPRY: 0.1 | 13 days supply | Qty: 30 | Fill #2

## 2016-09-05 MED FILL — LEVOCETIRIZINE 5 MG TABLET: 5 | 30 days supply | Qty: 30 | Fill #2

## 2016-09-23 MED FILL — AZELASTINE HCL 137 MCG SPRY: 0.1 | 13 days supply | Qty: 30 | Fill #3

## 2016-10-02 ENCOUNTER — Other Ambulatory Visit: Payer: Self-pay | Admitting: Internal Medicine

## 2016-10-02 DIAGNOSIS — M7989 Other specified soft tissue disorders: Secondary | ICD-10-CM

## 2016-10-02 DIAGNOSIS — K219 Gastro-esophageal reflux disease without esophagitis: Secondary | ICD-10-CM | POA: Diagnosis not present

## 2016-10-02 DIAGNOSIS — J45909 Unspecified asthma, uncomplicated: Secondary | ICD-10-CM | POA: Diagnosis not present

## 2016-10-02 DIAGNOSIS — J309 Allergic rhinitis, unspecified: Secondary | ICD-10-CM | POA: Diagnosis not present

## 2016-10-02 DIAGNOSIS — J310 Chronic rhinitis: Secondary | ICD-10-CM | POA: Diagnosis not present

## 2016-10-03 DIAGNOSIS — M7989 Other specified soft tissue disorders: Secondary | ICD-10-CM | POA: Diagnosis not present

## 2016-10-03 DIAGNOSIS — E785 Hyperlipidemia, unspecified: Secondary | ICD-10-CM | POA: Diagnosis not present

## 2016-10-03 DIAGNOSIS — E559 Vitamin D deficiency, unspecified: Secondary | ICD-10-CM | POA: Diagnosis not present

## 2016-10-06 MED FILL — LEVOCETIRIZINE 5 MG TABLET: 5 | 30 days supply | Qty: 30 | Fill #3

## 2016-10-06 MED FILL — AZELASTINE HCL 137 MCG SPRY: 0.1 | 13 days supply | Qty: 30 | Fill #4

## 2016-10-07 MED FILL — VENTOLIN HFA 90 MCG INHALER: 108 (90 BAS | 90 days supply | Qty: 54 | Fill #0

## 2016-10-09 ENCOUNTER — Other Ambulatory Visit: Payer: Self-pay | Admitting: Internal Medicine

## 2016-10-09 ENCOUNTER — Ambulatory Visit
Admission: RE | Admit: 2016-10-09 | Discharge: 2016-10-09 | Disposition: A | Discharge status: Home / Self Care | Payer: BLUE CROSS/BLUE SHIELD | Source: Ambulatory Visit | Attending: Internal Medicine | Admitting: Internal Medicine

## 2016-10-09 DIAGNOSIS — M19072 Primary osteoarthritis, left ankle and foot: Secondary | ICD-10-CM | POA: Diagnosis not present

## 2016-10-09 DIAGNOSIS — M1711 Unilateral primary osteoarthritis, right knee: Secondary | ICD-10-CM | POA: Diagnosis not present

## 2016-10-09 DIAGNOSIS — M7989 Other specified soft tissue disorders: Secondary | ICD-10-CM

## 2016-10-09 DIAGNOSIS — R600 Localized edema: Secondary | ICD-10-CM | POA: Diagnosis not present

## 2016-10-09 MED FILL — CIPROFLOXACIN HCL 500 MG TA: 500 | 6 days supply | Qty: 12 | Fill #0

## 2016-10-16 DIAGNOSIS — M1711 Unilateral primary osteoarthritis, right knee: Secondary | ICD-10-CM | POA: Diagnosis not present

## 2016-12-17 DIAGNOSIS — J301 Allergic rhinitis due to pollen: Secondary | ICD-10-CM | POA: Diagnosis not present

## 2016-12-17 DIAGNOSIS — J3089 Other allergic rhinitis: Secondary | ICD-10-CM | POA: Diagnosis not present

## 2016-12-17 DIAGNOSIS — T781XXA Other adverse food reactions, not elsewhere classified, initial encounter: Secondary | ICD-10-CM | POA: Diagnosis not present

## 2016-12-17 DIAGNOSIS — J4520 Mild intermittent asthma, uncomplicated: Secondary | ICD-10-CM | POA: Diagnosis not present

## 2016-12-24 MED FILL — AZELASTINE HCL 137 MCG SPRY: 0.1 | 13 days supply | Qty: 30 | Fill #5

## 2016-12-24 MED FILL — AZITHROMYCIN 250 MG TABLET: 250 | 1 days supply | Qty: 2 | Fill #0

## 2017-01-16 MED FILL — LEVOCETIRIZINE 5 MG TABLET: 5 | 30 days supply | Qty: 30 | Fill #4

## 2017-01-29 DIAGNOSIS — Z23 Encounter for immunization: Secondary | ICD-10-CM | POA: Diagnosis not present

## 2017-02-17 MED FILL — LEVOCETIRIZINE 5 MG TABLET: 5 | 30 days supply | Qty: 30 | Fill #5

## 2017-03-11 ENCOUNTER — Ambulatory Visit (INDEPENDENT_AMBULATORY_CARE_PROVIDER_SITE_OTHER): Payer: BLUE CROSS/BLUE SHIELD | Admitting: Neurology

## 2017-03-11 ENCOUNTER — Encounter: Payer: Self-pay | Admitting: Neurology

## 2017-03-11 ENCOUNTER — Encounter (INDEPENDENT_AMBULATORY_CARE_PROVIDER_SITE_OTHER): Payer: Self-pay

## 2017-03-11 VITALS — BP 124/75 | HR 64 | Ht 63.0 in | Wt 144.5 lb

## 2017-03-11 DIAGNOSIS — G43009 Migraine without aura, not intractable, without status migrainosus: Secondary | ICD-10-CM

## 2017-03-11 MED ORDER — SUMATRIPTAN SUCCINATE 100 MG PO TABS: 50.0000 mg | ORAL_TABLET | Freq: Two times a day (BID) | ORAL | 3 refills | Status: DC | PRN

## 2017-03-11 MED ORDER — CELEBREX 200 MG PO CAPS: ORAL_CAPSULE | ORAL | 0 refills | Status: DC

## 2017-03-11 MED FILL — SUMATRIPTAN SUCC 100 MG TAB: 100 | 30 days supply | Qty: 18 | Fill #0

## 2017-03-11 NOTE — Progress Notes (Signed)
Faxed printed/signed rx sumatriptan to [place] at [phone]. Received fax confirmation.
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
## 2017-03-12 ENCOUNTER — Telehealth: Payer: Self-pay | Admitting: Neurology

## 2017-03-12 MED FILL — CELECOXIB 200 MG CAP: 200 | 90 days supply | Qty: 180 | Fill #0

## 2017-03-12 NOTE — Telephone Encounter (Signed)
Susan Crosby outpt pharm called advised celebrex RX was rec'd and written for brand, pt has been taking generic. [name] was skyped and she approved it to be refilled for generic.  FYI

## 2017-03-12 NOTE — Telephone Encounter (Signed)
Dr. Willis- FYI 

## 2017-03-16 MED FILL — AZELASTINE HCL 137 MCG SPRY: 0.1 | 25 days supply | Qty: 30 | Fill #0

## 2017-03-18 DIAGNOSIS — L821 Other seborrheic keratosis: Secondary | ICD-10-CM | POA: Diagnosis not present

## 2017-03-18 DIAGNOSIS — L570 Actinic keratosis: Secondary | ICD-10-CM | POA: Diagnosis not present

## 2017-03-18 MED FILL — LEVOCETIRIZINE 5 MG TABLET: 5 | 30 days supply | Qty: 30 | Fill #0

## 2017-04-03 DIAGNOSIS — H2513 Age-related nuclear cataract, bilateral: Secondary | ICD-10-CM | POA: Diagnosis not present

## 2017-04-17 ENCOUNTER — Telehealth: Payer: Self-pay | Admitting: Obstetrics & Gynecology

## 2017-04-17 NOTE — Telephone Encounter (Signed)
Left patient a message to call back to reschedule a future appointment that was cancelled by the provider for an AEX with Dr. Sabra Heck.

## 2017-04-21 MED FILL — LEVOCETIRIZINE 5 MG TABLET: 5 | 30 days supply | Qty: 30 | Fill #1

## 2017-05-13 ENCOUNTER — Ambulatory Visit (INDEPENDENT_AMBULATORY_CARE_PROVIDER_SITE_OTHER): Payer: BLUE CROSS/BLUE SHIELD | Admitting: Obstetrics & Gynecology

## 2017-05-13 ENCOUNTER — Other Ambulatory Visit: Payer: Self-pay

## 2017-05-13 ENCOUNTER — Encounter: Payer: Self-pay | Admitting: Obstetrics & Gynecology

## 2017-05-13 VITALS — BP 102/68 | HR 72 | Resp 14 | Ht 63.5 in | Wt 143.0 lb

## 2017-05-13 DIAGNOSIS — Z01419 Encounter for gynecological examination (general) (routine) without abnormal findings: Secondary | ICD-10-CM | POA: Diagnosis not present

## 2017-05-13 NOTE — Progress Notes (Addendum)
62 y.o. G0P0000 MarriedCaucasianF here for annual exam.  Denies vaginal bleeding.  Seeing Dr Inda Merlin next week.  Will do lab work then.  If hasn't done Hep C testing, she will have done next week.  Patient's last menstrual period was 03/31/1994.          Sexually active: No.  The current method of family planning is status post hysterectomy.    Exercising: Yes.    walking, hiking, gyrotonic  Smoker:  no  Health Maintenance: Pap:  11/28/13 negative History of abnormal Pap:  no MMG:  03/11/16 BIRADS 2 benign.  Aware this is due.   Colonoscopy:  2013, repeat 5 years.  Aware this is due .  BMD:   08/16/10 osteopenia TDaP:  07/30/11  Pneumonia vaccine(s):  no Shingrix:   11/16 with PCP  Hep C testing: unsure- will check with PCP  Screening Labs: PCP, Hb today: PCP, Urine today: not collected    reports that  has never smoked. she has never used smokeless tobacco. She reports that she drinks about 2.4 oz of alcohol per week. She reports that she does not use drugs.  Past Medical History:  Diagnosis Date  . Ankle fracture, left 04/06/11  . Arthritis    in neck  . Asthma 1993  . History of migraine headaches   . IBS (irritable bowel syndrome)   . Meniscus tear    patella spurs, arthritis-having steroid injections  . Migraine without aura, without mention of intractable migraine without mention of status migrainosus 03/09/2013  . Osteopenia    mild  . Precancerous lesion 03/2016  . Wrist fracture 2007   pisiform     x3    Past Surgical History:  Procedure Laterality Date  . DIAGNOSTIC LAPAROSCOPY  10/96   Endometriosis  . FRACTURE SURGERY Left 2007   ulner/radius  . LIPOSUCTION  2001  . NOSE SURGERY  1987  . VAGINAL HYSTERECTOMY  11/96   Dysmenorrhea-TVH    Current Outpatient Medications  Medication Sig Dispense Refill  . albuterol (VENTOLIN HFA) 108 (90 BASE) MCG/ACT inhaler Inhale 1-2 puffs into the lungs. 15 min prior to exercise of every 4 hours as needed.  Use with spacer.      Marland Kitchen azelastine (ASTELIN) 0.1 % nasal spray Place 1 spray into both nostrils 2 (two) times daily. Use in each nostril as directed    . CELEBREX 200 MG capsule TAKE 1 CAPSULE BY MOUTH 2 TIMES DAILY AS NEEDED. 180 capsule 0  . ciprofloxacin (CIPRO) 500 MG tablet Take 500 mg by mouth as needed. For traveler's diarrhea    . fluorouracil (EFUDEX) 5 % cream Apply 1 Dose topically as needed.   0  . levocetirizine (XYZAL) 5 MG tablet Take 5 mg by mouth every evening.    . mupirocin ointment (BACTROBAN) 2 % Place 1 application into the nose as needed.    . ondansetron (ZOFRAN ODT) 4 MG disintegrating tablet Take 1 tablet (4 mg total) by mouth every 8 (eight) hours as needed for nausea. 30 tablet 2  . saccharomyces boulardii (FLORASTOR) 250 MG capsule Take 250 mg by mouth daily.     Marland Kitchen Spacer/Aero-Holding Chambers (AEROCHAMBER MV) inhaler Use as instructed     . SUMAtriptan (IMITREX) 100 MG tablet Take 0.5 tablets (50 mg total) by mouth 2 (two) times daily as needed for migraine or headache. May repeat in 2 hours if headache persists or recurs. Pt take may take 1/2 of pill 27 tablet 3   No  current facility-administered medications for this visit.     Family History  Problem Relation Age of Onset  . Asthma Sister        2 years younger  . Diabetes Sister        2 years younger  . Hypertension Father   . Rheum arthritis Sister        4 years younger  . Kidney failure Mother        complications from surgery  . Colitis Mother   . Crohn's disease Maternal Aunt     ROS:  Pertinent items are noted in HPI.  Otherwise, a comprehensive ROS was negative.  Exam:   BP 102/68 (BP Location: Right Arm, Patient Position: Sitting, Cuff Size: Normal)   Pulse 72   Resp 14   Ht 5' 3.5" (1.613 m)   Wt 143 lb (64.9 kg)   LMP 03/31/1994   BMI 24.93 kg/m    Height: 5' 3.5" (161.3 cm)  Ht Readings from Last 3 Encounters:  05/13/17 5' 3.5" (1.613 m)  03/11/17 5\' 3"  (1.6 m)  04/18/16 5\' 3"  (1.6 m)    General  appearance: alert, cooperative and appears stated age Head: Normocephalic, without obvious abnormality, atraumatic Neck: no adenopathy, supple, symmetrical, trachea midline and thyroid normal to inspection and palpation Lungs: clear to auscultation bilaterally Breasts: normal appearance, no masses or tenderness Heart: regular rate and rhythm Abdomen: soft, non-tender; bowel sounds normal; no masses,  no organomegaly Extremities: extremities normal, atraumatic, no cyanosis or edema Skin: Skin color, texture, turgor normal. No rashes or lesions Lymph nodes: Cervical, supraclavicular, and axillary nodes normal. No abnormal inguinal nodes palpated Neurologic: Grossly normal   Pelvic: External genitalia:  no lesions              Urethra:  normal appearing urethra with no masses, tenderness or lesions              Bartholins and Skenes: normal                 Vagina: normal appearing vagina with normal color and discharge, no lesions              Cervix: absent              Pap taken: No. Bimanual Exam:  Uterus:  uterus absent              Adnexa: no mass, fullness, tenderness               Rectovaginal: Confirms               Anus:  normal sphincter tone, no lesions  Chaperone was present for exam.  A:  Well Woman with normal exam H/O [facility] 1996 for dysmenorrhea PMP, no HRT Asthma Allergies Remote hx of narcotic addiction Right knee arthritis  P:   Mammogram guidelines reviewed.  Pt aware due.   Will do BMD with next MMG pap smear not obtained today Lab work will be done with Dr. Inda Merlin next week.  Will ask for Dr. Inda Merlin to do this. D/w pt Shingrix vaccination.  She will see if Dr. Inda Merlin' office has this. She will call for colonoscopy appt this spring as well. return annually or prn

## 2017-05-19 MED FILL — LEVOCETIRIZINE 5 MG TABLET: 5 | 30 days supply | Qty: 30 | Fill #2

## 2017-05-20 DIAGNOSIS — J310 Chronic rhinitis: Secondary | ICD-10-CM | POA: Diagnosis not present

## 2017-05-20 DIAGNOSIS — Z79899 Other long term (current) drug therapy: Secondary | ICD-10-CM | POA: Diagnosis not present

## 2017-05-20 DIAGNOSIS — M179 Osteoarthritis of knee, unspecified: Secondary | ICD-10-CM | POA: Diagnosis not present

## 2017-05-20 DIAGNOSIS — E785 Hyperlipidemia, unspecified: Secondary | ICD-10-CM | POA: Diagnosis not present

## 2017-05-20 DIAGNOSIS — K580 Irritable bowel syndrome with diarrhea: Secondary | ICD-10-CM | POA: Diagnosis not present

## 2017-05-20 DIAGNOSIS — E559 Vitamin D deficiency, unspecified: Secondary | ICD-10-CM | POA: Diagnosis not present

## 2017-05-20 DIAGNOSIS — M7989 Other specified soft tissue disorders: Secondary | ICD-10-CM | POA: Diagnosis not present

## 2017-05-20 DIAGNOSIS — Z0000 Encounter for general adult medical examination without abnormal findings: Secondary | ICD-10-CM | POA: Diagnosis not present

## 2017-05-20 MED FILL — MUPIROCIN 2% OINTMENT: 2 | 7 days supply | Qty: 22 | Fill #0

## 2017-06-17 DIAGNOSIS — Z780 Asymptomatic menopausal state: Secondary | ICD-10-CM | POA: Diagnosis not present

## 2017-06-17 DIAGNOSIS — M8589 Other specified disorders of bone density and structure, multiple sites: Secondary | ICD-10-CM | POA: Diagnosis not present

## 2017-06-17 DIAGNOSIS — Z1231 Encounter for screening mammogram for malignant neoplasm of breast: Secondary | ICD-10-CM | POA: Diagnosis not present

## 2017-06-18 MED FILL — LEVOCETIRIZINE 5 MG TABLET: 5 | 30 days supply | Qty: 30 | Fill #3

## 2017-06-22 MED FILL — AZELASTINE HCL 137 MCG SPRY: 0.1 | 25 days supply | Qty: 30 | Fill #1

## 2017-06-29 ENCOUNTER — Encounter: Payer: Self-pay | Admitting: Obstetrics & Gynecology

## 2017-07-17 ENCOUNTER — Ambulatory Visit: Payer: BLUE CROSS/BLUE SHIELD | Admitting: Obstetrics & Gynecology

## 2017-07-22 MED FILL — AZELASTINE HCL 137 MCG SPRY: 0.1 | 25 days supply | Qty: 30 | Fill #2

## 2017-07-22 MED FILL — LEVOCETIRIZINE 5 MG TABLET: 5 | 30 days supply | Qty: 30 | Fill #4

## 2017-08-25 MED FILL — LEVOCETIRIZINE 5 MG TABLET: 5 | 30 days supply | Qty: 30 | Fill #0

## 2017-09-24 MED FILL — LEVOCETIRIZINE 5 MG TABLET: 5 | 30 days supply | Qty: 30 | Fill #1

## 2017-10-12 MED FILL — AZELASTINE HCL 137 MCG SPRY: 0.1 | 25 days supply | Qty: 30 | Fill #3

## 2017-10-23 MED FILL — LEVOCETIRIZINE 5 MG TABLET: 5 | 30 days supply | Qty: 30 | Fill #2

## 2017-11-13 DIAGNOSIS — H2513 Age-related nuclear cataract, bilateral: Secondary | ICD-10-CM | POA: Diagnosis not present

## 2017-11-18 DIAGNOSIS — M7989 Other specified soft tissue disorders: Secondary | ICD-10-CM | POA: Diagnosis not present

## 2017-11-18 DIAGNOSIS — K219 Gastro-esophageal reflux disease without esophagitis: Secondary | ICD-10-CM | POA: Diagnosis not present

## 2017-11-18 DIAGNOSIS — E559 Vitamin D deficiency, unspecified: Secondary | ICD-10-CM | POA: Diagnosis not present

## 2017-11-20 MED FILL — LEVOCETIRIZINE 5 MG TABLET: 5 | 30 days supply | Qty: 30 | Fill #3

## 2017-11-20 MED FILL — AZELASTINE HCL 137 MCG/SPRA: 137 | 25 days supply | Qty: 30 | Fill #4

## 2017-12-09 DIAGNOSIS — H5213 Myopia, bilateral: Secondary | ICD-10-CM | POA: Diagnosis not present

## 2017-12-09 DIAGNOSIS — H524 Presbyopia: Secondary | ICD-10-CM | POA: Diagnosis not present

## 2017-12-09 DIAGNOSIS — H2513 Age-related nuclear cataract, bilateral: Secondary | ICD-10-CM | POA: Diagnosis not present

## 2017-12-09 DIAGNOSIS — H35371 Puckering of macula, right eye: Secondary | ICD-10-CM | POA: Diagnosis not present

## 2017-12-10 MED FILL — MOXIFLOXACIN HCL 0.5 % SOLN: 0.5 | 15 days supply | Qty: 3 | Fill #0

## 2017-12-13 DIAGNOSIS — Z23 Encounter for immunization: Secondary | ICD-10-CM | POA: Diagnosis not present

## 2017-12-16 DIAGNOSIS — J3089 Other allergic rhinitis: Secondary | ICD-10-CM | POA: Diagnosis not present

## 2017-12-16 DIAGNOSIS — J4520 Mild intermittent asthma, uncomplicated: Secondary | ICD-10-CM | POA: Diagnosis not present

## 2017-12-16 DIAGNOSIS — T781XXA Other adverse food reactions, not elsewhere classified, initial encounter: Secondary | ICD-10-CM | POA: Diagnosis not present

## 2017-12-16 DIAGNOSIS — J301 Allergic rhinitis due to pollen: Secondary | ICD-10-CM | POA: Diagnosis not present

## 2017-12-16 MED FILL — AZELASTINE HCL 137 MCG/SPRA: 137 | 25 days supply | Qty: 30 | Fill #0

## 2017-12-17 MED FILL — PREDNISOLONE AC 1% EYE DROP: 1 | 50 days supply | Qty: 10 | Fill #0

## 2017-12-22 MED FILL — LEVOCETIRIZINE 5 MG TABLET: 5 | 30 days supply | Qty: 30 | Fill #0

## 2018-01-19 DIAGNOSIS — H25811 Combined forms of age-related cataract, right eye: Secondary | ICD-10-CM | POA: Diagnosis not present

## 2018-01-19 DIAGNOSIS — H2511 Age-related nuclear cataract, right eye: Secondary | ICD-10-CM | POA: Diagnosis not present

## 2018-01-21 MED FILL — MOXIFLOXACIN HCL 0.5 % SOLN: 0.5 | 15 days supply | Qty: 3 | Fill #0

## 2018-01-21 MED FILL — LEVOCETIRIZINE 5 MG TABLET: 5 | 30 days supply | Qty: 30 | Fill #1

## 2018-02-04 DIAGNOSIS — Z23 Encounter for immunization: Secondary | ICD-10-CM | POA: Diagnosis not present

## 2018-02-16 DIAGNOSIS — H2512 Age-related nuclear cataract, left eye: Secondary | ICD-10-CM | POA: Diagnosis not present

## 2018-02-16 DIAGNOSIS — H25812 Combined forms of age-related cataract, left eye: Secondary | ICD-10-CM | POA: Diagnosis not present

## 2018-02-19 MED FILL — LEVOCETIRIZINE 5 MG TABLET: 5 | 30 days supply | Qty: 30 | Fill #2

## 2018-03-17 ENCOUNTER — Telehealth: Payer: Self-pay

## 2018-03-17 ENCOUNTER — Other Ambulatory Visit: Payer: Self-pay

## 2018-03-17 ENCOUNTER — Encounter: Payer: Self-pay | Admitting: Neurology

## 2018-03-17 ENCOUNTER — Ambulatory Visit (INDEPENDENT_AMBULATORY_CARE_PROVIDER_SITE_OTHER): Payer: BLUE CROSS/BLUE SHIELD | Admitting: Neurology

## 2018-03-17 VITALS — BP 110/68 | HR 68 | Resp 16 | Ht 63.0 in | Wt 142.0 lb

## 2018-03-17 DIAGNOSIS — G43009 Migraine without aura, not intractable, without status migrainosus: Secondary | ICD-10-CM

## 2018-03-17 DIAGNOSIS — L853 Xerosis cutis: Secondary | ICD-10-CM | POA: Diagnosis not present

## 2018-03-17 DIAGNOSIS — L821 Other seborrheic keratosis: Secondary | ICD-10-CM | POA: Diagnosis not present

## 2018-03-17 DIAGNOSIS — L820 Inflamed seborrheic keratosis: Secondary | ICD-10-CM | POA: Diagnosis not present

## 2018-03-17 DIAGNOSIS — L570 Actinic keratosis: Secondary | ICD-10-CM | POA: Diagnosis not present

## 2018-03-17 MED ORDER — SUMATRIPTAN SUCCINATE 100 MG PO TABS: 50.0000 mg | ORAL_TABLET | Freq: Two times a day (BID) | ORAL | 3 refills | Status: DC | PRN

## 2018-03-17 NOTE — Progress Notes (Signed)
Reason for visit: Migraine headache  Susan Crosby is an 62 y.o. female  History of present illness:  Susan Crosby is a 62 year old right-handed white female with a history of migraine.  The patient has had fewer migraines this year than last, she has eliminated alcohol from her diet, she has been careful about avoiding spicy foods and tomato based foods that activate her headache.  Weather changes are a big activator as well.  The patient gets some benefit from the Imitrex.  She returns to this office for an evaluation.  Past Medical History:  Diagnosis Date  . Ankle fracture, left 04/06/11  . Arthritis    in neck  . Asthma 1993  . History of migraine headaches   . IBS (irritable bowel syndrome)   . Meniscus tear    patella spurs, arthritis-having steroid injections  . Migraine without aura, without mention of intractable migraine without mention of status migrainosus 03/09/2013  . Osteopenia    mild  . Precancerous lesion 03/2016  . Wrist fracture 2007   pisiform     x3    Past Surgical History:  Procedure Laterality Date  . CATARACT EXTRACTION Bilateral   . DIAGNOSTIC LAPAROSCOPY  10/96   Endometriosis  . FRACTURE SURGERY Left 2007   ulner/radius  . LIPOSUCTION  2001  . NOSE SURGERY  1987  . VAGINAL HYSTERECTOMY  11/96   Dysmenorrhea-TVH    Family History  Problem Relation Age of Onset  . Asthma Sister        2 years younger  . Diabetes Sister        2 years younger  . Hypertension Father   . Rheum arthritis Sister        4 years younger  . Kidney failure Mother        complications from surgery  . Colitis Mother   . Crohn's disease Maternal Aunt     Social history:  reports that she has never smoked. She has never used smokeless tobacco. She reports current alcohol use of about 4.0 standard drinks of alcohol per week. She reports that she does not use drugs.    Allergies  Allergen Reactions  . Cefpodoxime Diarrhea  . Cheratussin Ac  [Guaifenesin-Codeine]     Constipation-severe  . Fiorinal [Butalbital-Aspirin-Caffeine] Nausea And Vomiting and Other (See Comments)    Headaches   . Other     All narcotics-nausea and vomiting    Medications:  Prior to Admission medications   Medication Sig Start Date End Date Taking? Authorizing Provider  albuterol (VENTOLIN HFA) 108 (90 BASE) MCG/ACT inhaler Inhale 1-2 puffs into the lungs. 15 min prior to exercise of every 4 hours as needed.  Use with spacer.    Yes [provider]  azelastine (ASTELIN) 0.1 % nasal spray Place 1 spray into both nostrils 2 (two) times daily. Use in each nostril as directed   Yes [provider]  Calcium Carbonate (CALCIUM 600 PO) Take by mouth.   Yes [provider]  CELEBREX 200 MG capsule TAKE 1 CAPSULE BY MOUTH 2 TIMES DAILY AS NEEDED. 03/11/17  Yes Kathrynn Ducking, MD  Cholecalciferol (VITAMIN D3 PO) Take 600 mg by mouth daily.   Yes [provider]  ciprofloxacin (CIPRO) 500 MG tablet Take 500 mg by mouth as needed. For traveler's diarrhea   Yes [provider]  fluorouracil (EFUDEX) 5 % cream Apply 1 Dose topically as needed.  04/02/16  Yes [provider]  levocetirizine (  XYZAL) 5 MG tablet Take 5 mg by mouth every evening.   Yes [provider]  mupirocin ointment (BACTROBAN) 2 % Place 1 application into the nose as needed.   Yes [provider]  ondansetron (ZOFRAN ODT) 4 MG disintegrating tablet Take 1 tablet (4 mg total) by mouth every 8 (eight) hours as needed for nausea. 03/12/16  Yes Kathrynn Ducking, MD  saccharomyces boulardii (FLORASTOR) 250 MG capsule Take 250 mg by mouth daily.    Yes [provider]  Spacer/Aero-Holding Chambers (AEROCHAMBER MV) inhaler Use as instructed    Yes [provider]  SUMAtriptan (IMITREX) 100 MG tablet Take 0.5 tablets (50 mg total) by mouth 2 (two) times daily as needed for migraine or headache. May repeat in 2 hours if  headache persists or recurs. Pt take may take 1/2 of pill 03/17/18  Yes Kathrynn Ducking, MD    ROS:  Out of a complete 14 system review of symptoms, the patient complains only of the following symptoms, and all other reviewed systems are negative.  Headache  Blood pressure 110/68, pulse 68, resp. rate 16, height 5\' 3"  (1.6 m), weight 142 lb (64.4 kg), last menstrual period 03/31/1994.  Physical Exam  General: The patient is alert and cooperative at the time of the examination.  Skin: No significant peripheral edema is noted.   Neurologic Exam  Mental status: The patient is alert and oriented x 3 at the time of the examination. The patient has apparent normal recent and remote memory, with an apparently normal attention span and concentration ability.   Cranial nerves: Facial symmetry is present. Speech is normal, no aphasia or dysarthria is noted. Extraocular movements are full. Visual fields are full.  Motor: The patient has good strength in all 4 extremities.  Sensory examination: Soft touch sensation is symmetric on the face, arms, and legs.  Coordination: The patient has good finger-nose-finger and heel-to-shin bilaterally.  Gait and station: The patient has a normal gait. Tandem gait is normal. Romberg is negative. No drift is seen.  Reflexes: Deep tendon reflexes are symmetric.   Assessment/Plan:  1.  Migraine headache  The patient is doing fairly well at this point.  A prescription for the Imitrex will be given, she will follow-up in 1 year.  Jill Alexanders MD 03/17/2018 3:34 PM  [facility] [address] [place], [place] [address]  Phone [phone] Fax [phone]

## 2018-03-17 NOTE — Telephone Encounter (Signed)
Refill for Imitrex submitted via fax to [facility] (fax [phone]) Fax confirmation received.  MB RN

## 2018-03-22 DIAGNOSIS — H26493 Other secondary cataract, bilateral: Secondary | ICD-10-CM | POA: Diagnosis not present

## 2018-03-22 MED FILL — LEVOCETIRIZINE 5 MG TABLET: 5 | 30 days supply | Qty: 30 | Fill #3

## 2018-04-15 DIAGNOSIS — H26493 Other secondary cataract, bilateral: Secondary | ICD-10-CM | POA: Diagnosis not present

## 2018-04-15 MED FILL — LEVOCETIRIZINE 5 MG TABLET: 5 | 30 days supply | Qty: 30 | Fill #4

## 2018-04-28 MED FILL — AZELASTINE HCL 137 MCG SPRY: 0.1 | 25 days supply | Qty: 30 | Fill #1

## 2018-05-20 DIAGNOSIS — M25562 Pain in left knee: Secondary | ICD-10-CM | POA: Diagnosis not present

## 2018-05-20 DIAGNOSIS — M25561 Pain in right knee: Secondary | ICD-10-CM | POA: Diagnosis not present

## 2018-05-20 DIAGNOSIS — M1712 Unilateral primary osteoarthritis, left knee: Secondary | ICD-10-CM | POA: Diagnosis not present

## 2018-05-20 DIAGNOSIS — M1711 Unilateral primary osteoarthritis, right knee: Secondary | ICD-10-CM | POA: Diagnosis not present

## 2018-05-24 MED FILL — LEVOCETIRIZINE 5 MG TABLET: 5 | 30 days supply | Qty: 30 | Fill #5

## 2018-05-27 IMAGING — US US EXTREM LOW VENOUS BILAT
1 series · 13 of 24 positions shown · non-contrast
Comparison: None.

CLINICAL DATA: Intermittent bilateral lower extremity edema for
approximately 1 year.



[Series 1: us extrem low venous bilat · 0.07mm/px · 13 of 56 slices shown]
[im 1/56]
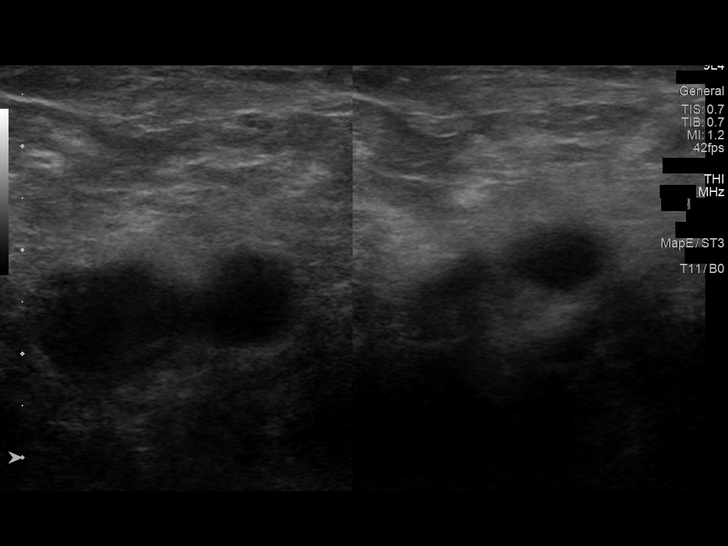
[im 5/56]
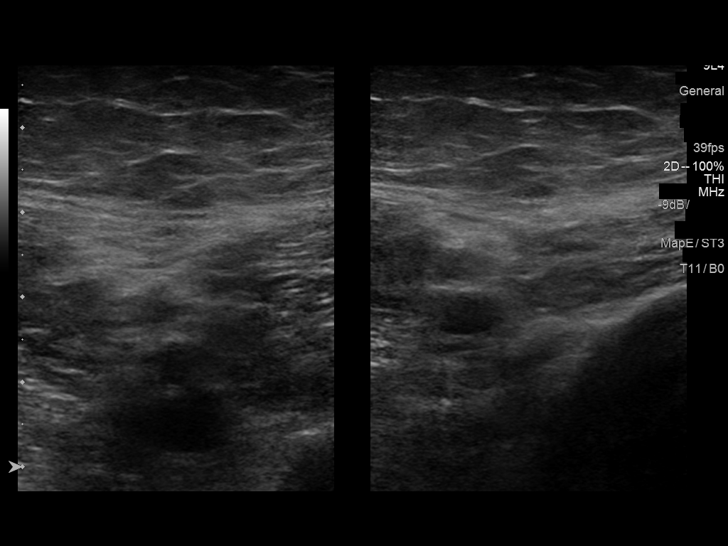
[im 10/56]
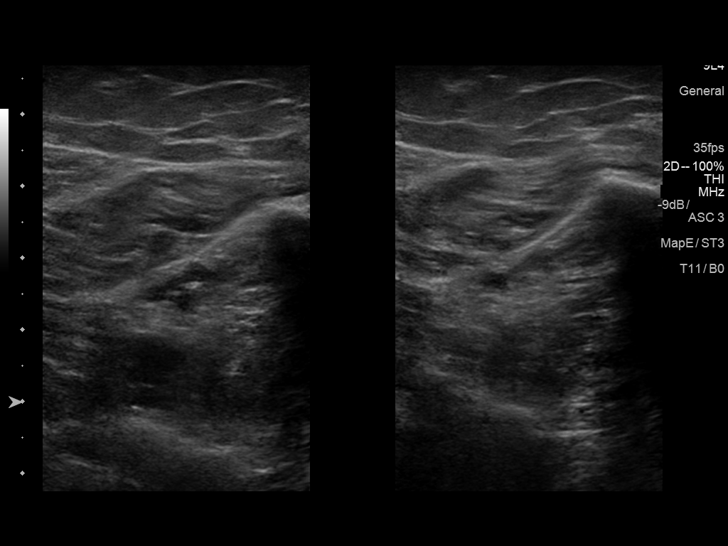
[im 15/56]
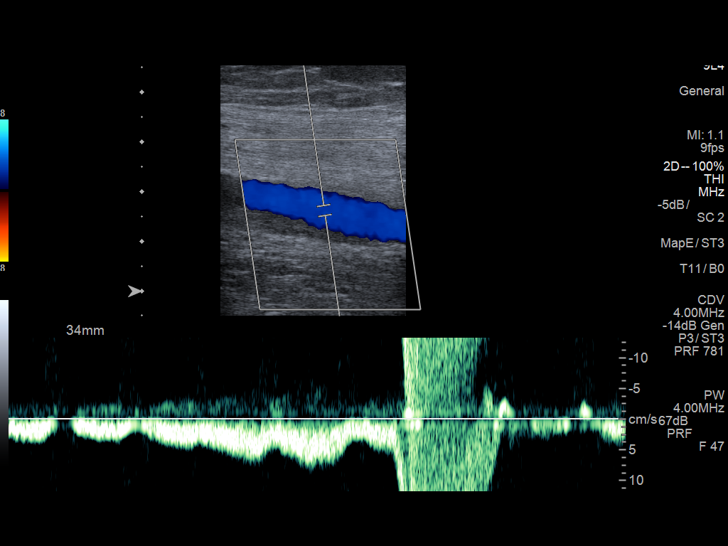
[im 20/56]
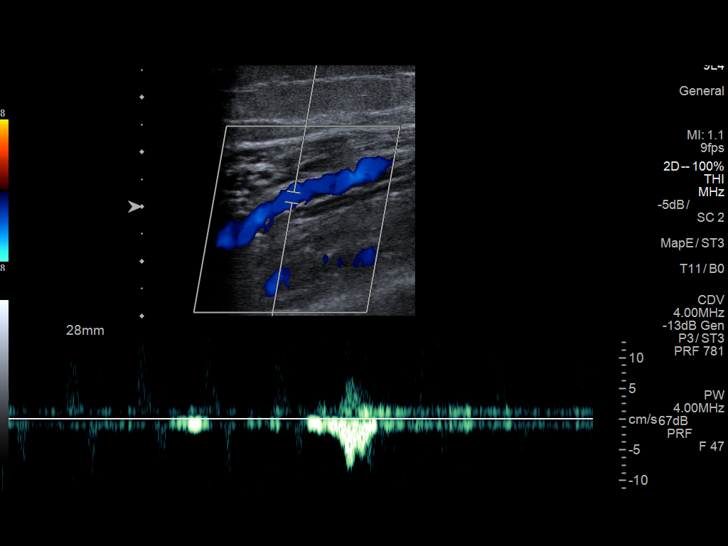
[im 24/56]
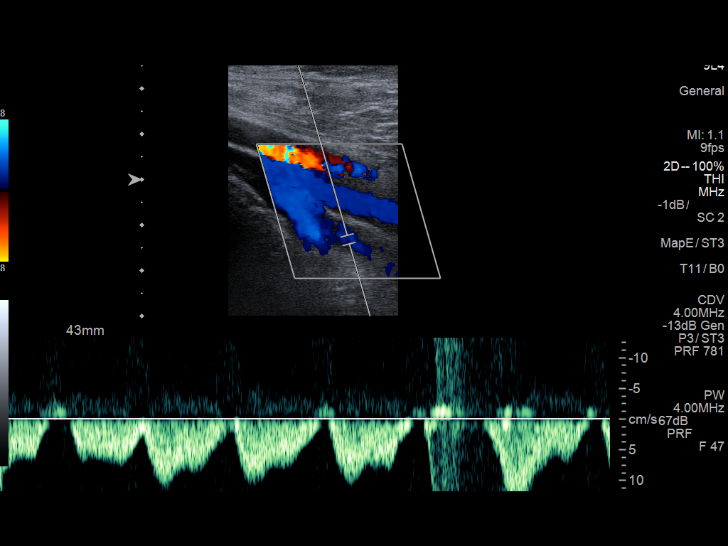
[im 29/56]
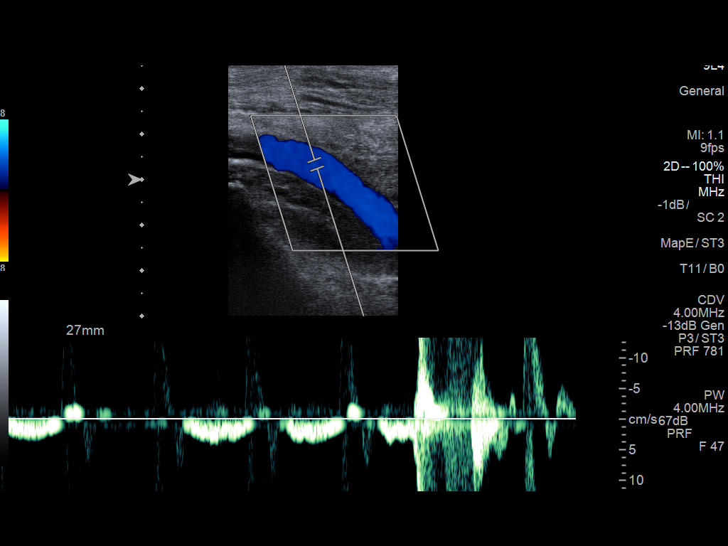
[im 32/56]
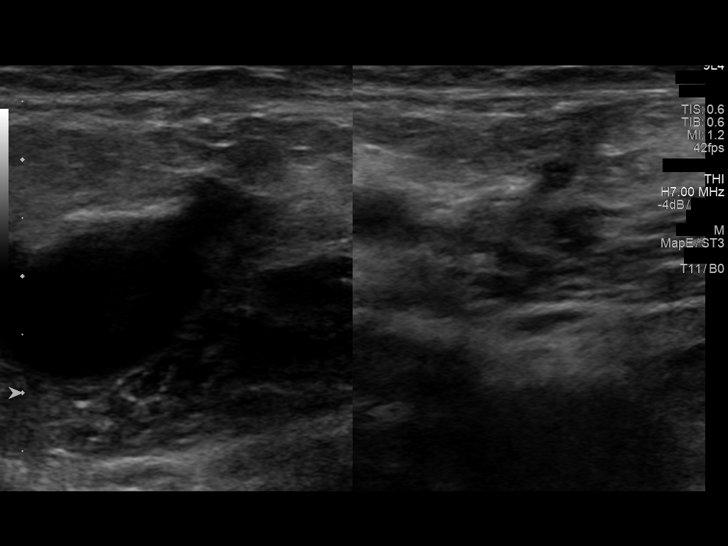
[im 36/56]
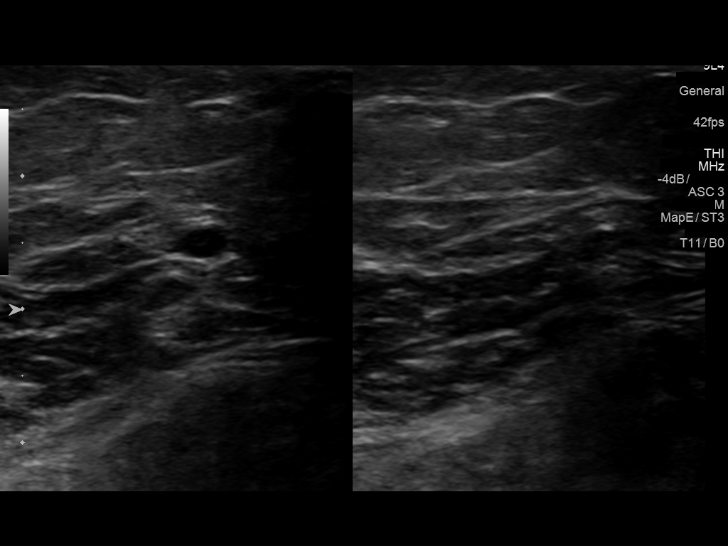
[im 41/56]
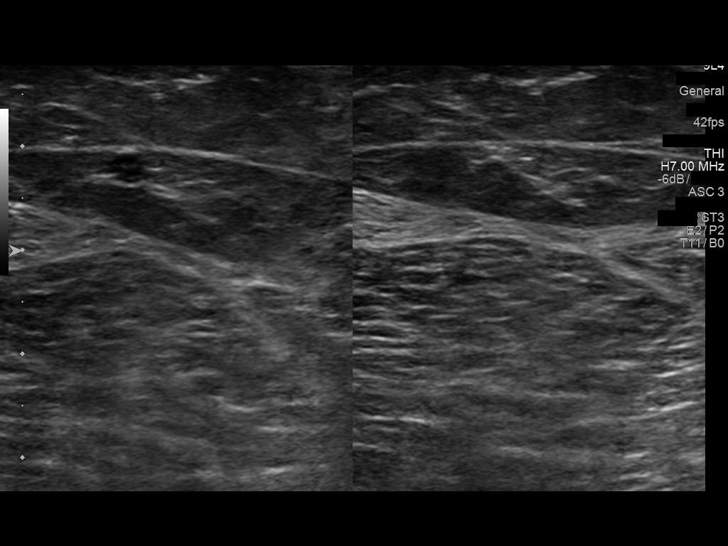
[im 46/56]
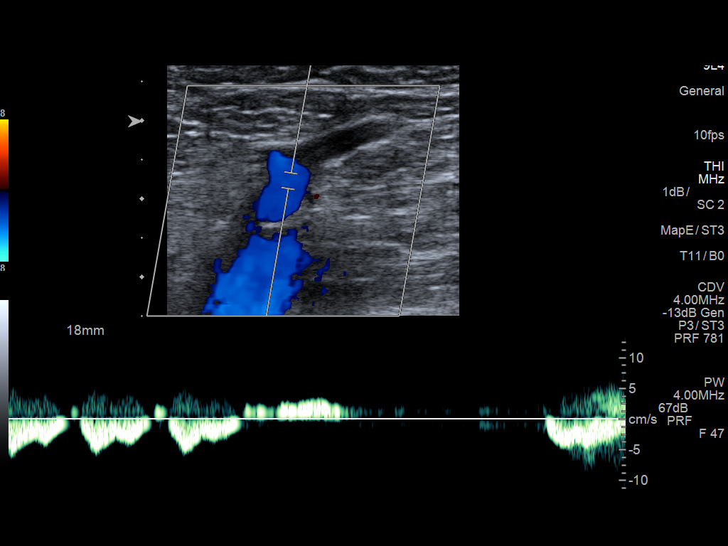
[im 51/56]
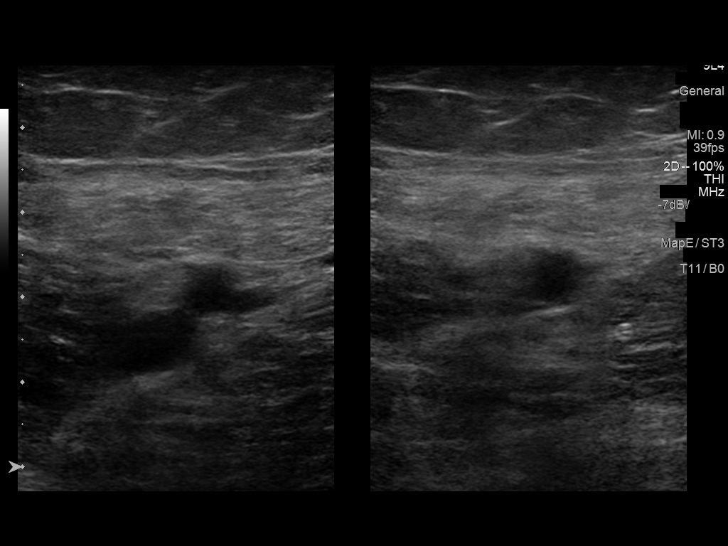
[im 56/56]
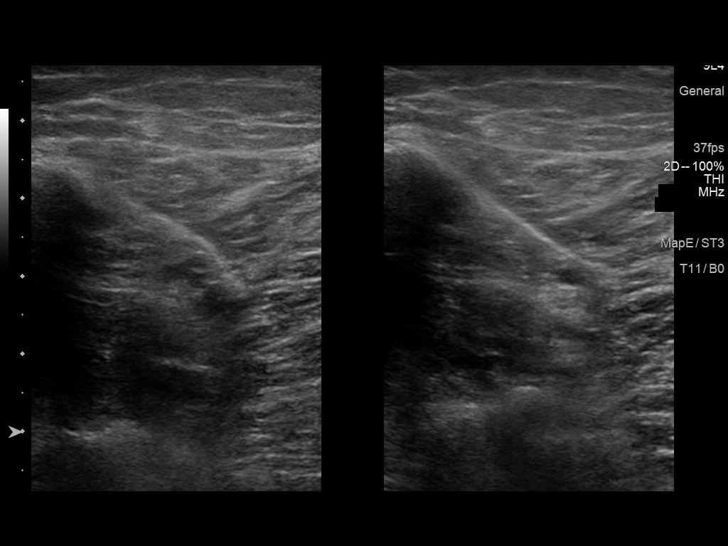

[13 of 24 positions shown; findings below may reference images not displayed]

FINDINGS: RIGHT LOWER EXTREMITY

Common Femoral Vein: No evidence of thrombus. Normal
compressibility, respiratory phasicity and response to augmentation.

Saphenofemoral Junction: No evidence of thrombus. Normal
compressibility and flow on color Doppler imaging.

Profunda Femoral Vein: No evidence of thrombus. Normal
compressibility and flow on color Doppler imaging.

Femoral Vein: No evidence of thrombus. Normal compressibility,
respiratory phasicity and response to augmentation.

Popliteal Vein: No evidence of thrombus. Normal compressibility,
respiratory phasicity and response to augmentation.

Calf Veins: No evidence of thrombus. Normal compressibility and flow
on color Doppler imaging.

Superficial Great Saphenous Vein: No evidence of thrombus. Normal
compressibility and flow on color Doppler imaging.

Venous Reflux:  Not evaluated.

Other Findings:  None.

LEFT LOWER EXTREMITY

Common Femoral Vein: No evidence of thrombus. Normal
compressibility, respiratory phasicity and response to augmentation.

Saphenofemoral Junction: No evidence of thrombus. Normal
compressibility and flow on color Doppler imaging.

Profunda Femoral Vein: No evidence of thrombus. Normal
compressibility and flow on color Doppler imaging.

Femoral Vein: No evidence of thrombus. Normal compressibility,
respiratory phasicity and response to augmentation.

Popliteal Vein: No evidence of thrombus. Normal compressibility,
respiratory phasicity and response to augmentation.

Calf Veins: No evidence of thrombus. Normal compressibility and flow
on color Doppler imaging.

Superficial Great Saphenous Vein: No evidence of thrombus. Normal
compressibility and flow on color Doppler imaging.

Venous Reflux:  Not evaluated.

Other Findings:  None.
IMPRESSION: No evidence of DVT involving either the right or left lower
extremity.

## 2018-06-02 DIAGNOSIS — Z0000 Encounter for general adult medical examination without abnormal findings: Secondary | ICD-10-CM | POA: Diagnosis not present

## 2018-06-02 DIAGNOSIS — E559 Vitamin D deficiency, unspecified: Secondary | ICD-10-CM | POA: Diagnosis not present

## 2018-06-02 DIAGNOSIS — J310 Chronic rhinitis: Secondary | ICD-10-CM | POA: Diagnosis not present

## 2018-06-02 DIAGNOSIS — M7989 Other specified soft tissue disorders: Secondary | ICD-10-CM | POA: Diagnosis not present

## 2018-06-02 DIAGNOSIS — E785 Hyperlipidemia, unspecified: Secondary | ICD-10-CM | POA: Diagnosis not present

## 2018-06-02 DIAGNOSIS — K219 Gastro-esophageal reflux disease without esophagitis: Secondary | ICD-10-CM | POA: Diagnosis not present

## 2018-06-02 DIAGNOSIS — J309 Allergic rhinitis, unspecified: Secondary | ICD-10-CM | POA: Diagnosis not present

## 2018-06-02 MED FILL — VENTOLIN HFA 90 MCG INHALER: 108 (90 BAS | 50 days supply | Qty: 54 | Fill #0

## 2018-06-14 MED FILL — AZELASTINE HCL 137 MCG SPRY: 0.1 | 25 days supply | Qty: 30 | Fill #2 | Status: TO

## 2018-06-16 MED FILL — LEVOCETIRIZINE 5 MG TABLET: 5 | 30 days supply | Qty: 30 | Fill #0 | Status: TO

## 2018-07-02 MED FILL — AZELASTINE HCL 137 MCG SPRY: 0.1 | 25 days supply | Qty: 30 | Fill #0

## 2018-07-02 MED FILL — LEVOCETIRIZINE 5 MG TABLET: 5 | 30 days supply | Qty: 30 | Fill #0

## 2018-07-19 ENCOUNTER — Ambulatory Visit: Payer: BLUE CROSS/BLUE SHIELD | Admitting: Obstetrics & Gynecology

## 2018-08-10 ENCOUNTER — Ambulatory Visit: Payer: BLUE CROSS/BLUE SHIELD | Admitting: Obstetrics & Gynecology

## 2018-08-11 MED FILL — AZELASTINE HCL 137 MCG SPRY: 0.1 | 25 days supply | Qty: 30 | Fill #1

## 2018-08-11 MED FILL — LEVOCETIRIZINE 5 MG TABLET: 5 | 30 days supply | Qty: 30 | Fill #1

## 2018-08-31 MED FILL — SUMAtriptan SUCCINATE 100 M: 100 | 30 days supply | Qty: 18 | Fill #0

## 2018-09-13 ENCOUNTER — Telehealth: Payer: Self-pay | Admitting: Neurology

## 2018-09-13 NOTE — Telephone Encounter (Signed)
I reached out the pt and she states as of April of this year her insurance changed to [facility] Ad[employer]age~ Member [phone] Group # [id number] [place] claims # [id number].   She states she last received her refill on 08/31/18 # 18 for a 30 day rx. After the refill she received a letter stating for refills would need a PA.  I advised I would initiate a proactive PA.  PA submitted via cover my med. (Key: [id number])  [facility] is reviewing your PA request. Typically an electronic response will be received within 72 hours. To check for an update later, open this request from your dashboard.  You may close this dialog and return to your dashboard to perform other tasks.

## 2018-09-13 NOTE — Telephone Encounter (Signed)
Pt is calling in stating she is currently under medicaid now and her insurance company is advising her she needs a PA in order to cont  to receive SUMAtriptan (IMITREX) 100 MG tablet

## 2018-09-14 NOTE — Telephone Encounter (Signed)
Received fax from [facility] stating PA has been canceled due to Sumatriptan 100 being approved under insurance already.

## 2018-09-17 MED FILL — LEVOCETIRIZINE 5 MG TABLET: 5 | 30 days supply | Qty: 30 | Fill #0

## 2018-10-18 MED FILL — LEVOCETIRIZINE 5 MG TABLET: 5 | 30 days supply | Qty: 30 | Fill #1

## 2018-11-02 MED FILL — AZELASTINE HCL 137 MCG SPRY: 0.1 | 25 days supply | Qty: 30 | Fill #0

## 2018-11-18 MED FILL — LEVOCETIRIZINE 5 MG TABLET: 5 | 30 days supply | Qty: 30 | Fill #0

## 2018-12-20 MED FILL — LEVOCETIRIZINE 5 MG TABLET: 5 | 30 days supply | Qty: 30 | Fill #1

## 2018-12-31 ENCOUNTER — Other Ambulatory Visit: Payer: Self-pay

## 2019-01-03 MED FILL — AZELASTINE HCL 137 MCG SPRY: 0.1 | 25 days supply | Qty: 30 | Fill #0

## 2019-01-04 ENCOUNTER — Encounter: Payer: Self-pay | Admitting: Obstetrics & Gynecology

## 2019-01-04 ENCOUNTER — Other Ambulatory Visit: Payer: Self-pay

## 2019-01-04 ENCOUNTER — Ambulatory Visit (INDEPENDENT_AMBULATORY_CARE_PROVIDER_SITE_OTHER): Payer: Medicare Other | Admitting: Obstetrics & Gynecology

## 2019-01-04 VITALS — BP 118/80 | HR 80 | Temp 97.2°F | Ht 62.5 in | Wt 150.0 lb

## 2019-01-04 DIAGNOSIS — Z01419 Encounter for gynecological examination (general) (routine) without abnormal findings: Secondary | ICD-10-CM

## 2019-01-04 NOTE — Progress Notes (Signed)
63 y.o. G0P0000 Married White or Caucasian female here for annual exam.  Denies vaginal bleeding.    Going to have flu shot with Dr. Inda Merlin.  This is already scheduled.    Patient's last menstrual period was 03/31/1994.          Sexually active: No.  The current method of family planning is status post hysterectomy.    Exercising: Yes.    pilates, hike Smoker:  no  Health Maintenance: Pap:  11/28/13 Neg  History of abnormal Pap:  no MMG:  03/11/16 BIRADS2:benign Colonoscopy:  2013 f/u 5 years  BMD:   06/17/17  TDaP:  2013 Pneumonia vaccine(s):  No Shingrix:   Completed  Flu vacc: will get with PCP Hep C testing: PCP Screening Labs: PCP   reports that she has never smoked. She has never used smokeless tobacco. She reports previous alcohol use. She reports that she does not use drugs.  Past Medical History:  Diagnosis Date  . Ankle fracture, left 04/06/11  . Arthritis    in neck  . Asthma 1993  . History of migraine headaches   . IBS (irritable bowel syndrome)   . Meniscus tear    patella spurs, arthritis-having steroid injections  . Migraine without aura, without mention of intractable migraine without mention of status migrainosus 03/09/2013  . Osteopenia    mild  . Precancerous lesion 03/2016  . Wrist fracture 2007   pisiform     x3    Past Surgical History:  Procedure Laterality Date  . CATARACT EXTRACTION Bilateral   . DIAGNOSTIC LAPAROSCOPY  10/96   Endometriosis  . FRACTURE SURGERY Left 2007   ulner/radius  . LIPOSUCTION  2001  . NOSE SURGERY  1987  . VAGINAL HYSTERECTOMY  11/96   Dysmenorrhea-TVH    Current Outpatient Medications  Medication Sig Dispense Refill  . albuterol (VENTOLIN HFA) 108 (90 BASE) MCG/ACT inhaler Inhale 1-2 puffs into the lungs. 15 min prior to exercise of every 4 hours as needed.  Use with spacer.     Marland Kitchen azelastine (ASTELIN) 0.1 % nasal spray Place 1 spray into both nostrils 2 (two) times daily. Use in each nostril as directed    .  Calcium Carbonate (CALCIUM 600 PO) Take by mouth.    . CELEBREX 200 MG capsule TAKE 1 CAPSULE BY MOUTH 2 TIMES DAILY AS NEEDED. 180 capsule 0  . Cholecalciferol (VITAMIN D3 PO) Take 600 mg by mouth daily.    . fluorouracil (EFUDEX) 5 % cream Apply 1 Dose topically as needed.   0  . levocetirizine (XYZAL) 5 MG tablet Take 5 mg by mouth every evening.    . mupirocin ointment (BACTROBAN) 2 % Place 1 application into the nose as needed.    . ondansetron (ZOFRAN ODT) 4 MG disintegrating tablet Take 1 tablet (4 mg total) by mouth every 8 (eight) hours as needed for nausea. 30 tablet 2  . saccharomyces boulardii (FLORASTOR) 250 MG capsule Take 250 mg by mouth daily.     Marland Kitchen Spacer/Aero-Holding Chambers (AEROCHAMBER MV) inhaler Use as instructed     . SUMAtriptan (IMITREX) 100 MG tablet Take 0.5 tablets (50 mg total) by mouth 2 (two) times daily as needed for migraine or headache. May repeat in 2 hours if headache persists or recurs. Pt take may take 1/2 of pill 27 tablet 3   No current facility-administered medications for this visit.     Family History  Problem Relation Age of Onset  . Asthma Sister  2 years younger  . Diabetes Sister        2 years younger  . Hypertension Father   . Rheum arthritis Sister        4 years younger  . Kidney failure Mother        complications from surgery  . Colitis Mother   . Crohn's disease Maternal Aunt     Review of Systems  All other systems reviewed and are negative.   Exam:   BP 118/80   Pulse 80   Temp (!) 97.2 F (36.2 C) (Temporal)   Ht 5' 2.5" (1.588 m)   Wt 150 lb (68 kg)   LMP 03/31/1994   BMI 27.00 kg/m   Height: 5' 2.5" (158.8 cm)  Ht Readings from Last 3 Encounters:  01/04/19 5' 2.5" (1.588 m)  03/17/18 5\' 3"  (1.6 m)  05/13/17 5' 3.5" (1.613 m)    General appearance: alert, cooperative and appears stated age Head: Normocephalic, without obvious abnormality, atraumatic Neck: no adenopathy, supple, symmetrical, trachea  midline and thyroid normal to inspection and palpation Lungs: clear to auscultation bilaterally Breasts: normal appearance, no masses or tenderness Heart: regular rate and rhythm Abdomen: soft, non-tender; bowel sounds normal; no masses,  no organomegaly Extremities: extremities normal, atraumatic, no cyanosis or edema Skin: Skin color, texture, turgor normal. No rashes or lesions Lymph nodes: Cervical, supraclavicular, and axillary nodes normal. No abnormal inguinal nodes palpated Neurologic: Grossly normal   Pelvic: External genitalia:  no lesions              Urethra:  normal appearing urethra with no masses, tenderness or lesions              Bartholins and Skenes: normal                 Vagina: normal appearing vagina with normal color and discharge, no lesions              Cervix: absent              Pap taken: No. Bimanual Exam:  Uterus:  uterus absent              Adnexa: normal adnexa               Rectovaginal: Confirms               Anus:  normal sphincter tone, no lesions  Chaperone was present for exam.  A:  Well Woman with normal exam H/o TVH 1996 (dysmenorrhea) PMP, no HRT  H/O asthma Remote hx of narcotic addiction Right knee arthritis H/o migraines  P:   Mammogram guidelines reviewed.  She is aware this is overdue. pap smear not obtained Lab work done with Dr. Inda Merlin 3/19.  He did not recommend any this year Aware colonoscopy is due.  We discussed this last year and again this year.  Declines doing this in 2020.   BMD done 2019 Return annually or prn

## 2019-01-19 MED FILL — LEVOCETIRIZINE 5 MG TABLET: 5 | 30 days supply | Qty: 30 | Fill #2

## 2019-02-10 ENCOUNTER — Other Ambulatory Visit: Payer: Self-pay

## 2019-02-10 ENCOUNTER — Emergency Department (HOSPITAL_COMMUNITY): Payer: Medicare Other

## 2019-02-10 ENCOUNTER — Emergency Department (HOSPITAL_COMMUNITY)
Admission: EM | Admit: 2019-02-10 | Discharge: 2019-02-10 | Disposition: A | Discharge status: Home / Self Care | Payer: Medicare Other | Attending: Emergency Medicine | Admitting: Emergency Medicine

## 2019-02-10 DIAGNOSIS — S99911A Unspecified injury of right ankle, initial encounter: Secondary | ICD-10-CM | POA: Diagnosis present

## 2019-02-10 DIAGNOSIS — Y92018 Other place in single-family (private) house as the place of occurrence of the external cause: Secondary | ICD-10-CM | POA: Insufficient documentation

## 2019-02-10 DIAGNOSIS — Z79899 Other long term (current) drug therapy: Secondary | ICD-10-CM | POA: Insufficient documentation

## 2019-02-10 DIAGNOSIS — Y9301 Activity, walking, marching and hiking: Secondary | ICD-10-CM | POA: Diagnosis not present

## 2019-02-10 DIAGNOSIS — W109XXA Fall (on) (from) unspecified stairs and steps, initial encounter: Secondary | ICD-10-CM | POA: Diagnosis not present

## 2019-02-10 DIAGNOSIS — Y999 Unspecified external cause status: Secondary | ICD-10-CM | POA: Insufficient documentation

## 2019-02-10 DIAGNOSIS — S82831A Other fracture of upper and lower end of right fibula, initial encounter for closed fracture: Secondary | ICD-10-CM

## 2019-02-10 DIAGNOSIS — J45909 Unspecified asthma, uncomplicated: Secondary | ICD-10-CM | POA: Diagnosis not present

## 2019-02-10 NOTE — Discharge Instructions (Addendum)
Follow-up with orthopedics as discussed

## 2019-02-10 NOTE — ED Triage Notes (Signed)
Pt reports she fell down the steps and her right ankle inverted. Patient has swelling but no obvious deformity.

## 2019-02-10 NOTE — ED Provider Notes (Signed)
[facility] DEPT Provider Note   CSN: [id number] Arrival date & time: 02/10/19  1805     History   Chief Complaint Chief Complaint  Patient presents with  . Ankle Injury    HPI Susan Crosby is a 63 y.o. female.     Patient states she was descending stairs at home when she slipped. She fell down several steps, landing on inverted right foot. She did not lose consciousness or hit her head. She is complaining of pain and swelling to the lateral aspect of the right ankle.   The history is provided by the patient. No language interpreter was used.  Ankle Injury This is a new problem. The current episode started 1 to 2 hours ago. The problem has not changed since onset.   Past Medical History:  Diagnosis Date  . Ankle fracture, left 04/06/11  . Arthritis    in neck  . Asthma 1993  . History of migraine headaches   . IBS (irritable bowel syndrome)   . Meniscus tear    patella spurs, arthritis-having steroid injections  . Migraine without aura, without mention of intractable migraine without mention of status migrainosus 03/09/2013  . Osteopenia    mild  . Precancerous lesion 03/2016  . Wrist fracture 2007   pisiform     x3    Patient Active Problem List   Diagnosis Date Noted  . Migraine without aura 03/09/2013  . ANEMIA 05/16/2009  . ACTINIC KERATOSIS 05/16/2009  . CHEST PAIN 05/16/2009  . ACUTE MAXILLARY SINUSITIS 10/30/2008  . ADVEF, DRUG/MED/BIOL SUBST, ANESTHESIA 11/05/2006  . MUSCLE SPASM, TRAPEZIUS MUSCLE, RIGHT 11/03/2006  . MIGRAINE, UNSPEC., W/O INTRACTABLE MIGRAINE 05/28/2006  . RHINITIS, ALLERGIC 05/28/2006  . ASTHMA, UNSPECIFIED 05/28/2006  . HERNIA, HIATAL, NONCONGENITAL 05/28/2006    Past Surgical History:  Procedure Laterality Date  . CATARACT EXTRACTION Bilateral   . DIAGNOSTIC LAPAROSCOPY  10/96   Endometriosis  . FRACTURE SURGERY Left 2007   ulner/radius  . LIPOSUCTION  2001  . NOSE SURGERY  1987  .  VAGINAL HYSTERECTOMY  11/96   Dysmenorrhea-TVH     OB History    Gravida  0   Para  0   Term  0   Preterm  0   AB  0   Living  0     SAB  0   TAB  0   Ectopic  0   Multiple  0   Live Births               Home Medications    Prior to Admission medications   Medication Sig Start Date End Date Taking? Authorizing Provider  albuterol (VENTOLIN HFA) 108 (90 BASE) MCG/ACT inhaler Inhale 1-2 puffs into the lungs. 15 min prior to exercise of every 4 hours as needed.  Use with spacer.     [provider]  azelastine (ASTELIN) 0.1 % nasal spray Place 1 spray into both nostrils 2 (two) times daily. Use in each nostril as directed    [provider]  Calcium Carbonate (CALCIUM 600 PO) Take by mouth.    [provider]  CELEBREX 200 MG capsule TAKE 1 CAPSULE BY MOUTH 2 TIMES DAILY AS NEEDED. 03/11/17   Kathrynn Ducking, MD  Cholecalciferol (VITAMIN D3 PO) Take 600 mg by mouth daily.    [provider]  fluorouracil (EFUDEX) 5 % cream Apply 1 Dose topically as needed.  04/02/16   [provider]  levocetirizine Harlow Ohms)  5 MG tablet Take 5 mg by mouth every evening.    [provider]  mupirocin ointment (BACTROBAN) 2 % Place 1 application into the nose as needed.    [provider]  saccharomyces boulardii (FLORASTOR) 250 MG capsule Take 250 mg by mouth daily.     [provider]  Spacer/Aero-Holding Chambers (AEROCHAMBER MV) inhaler Use as instructed     [provider]  SUMAtriptan (IMITREX) 100 MG tablet Take 0.5 tablets (50 mg total) by mouth 2 (two) times daily as needed for migraine or headache. May repeat in 2 hours if headache persists or recurs. Pt take may take 1/2 of pill 03/17/18   Kathrynn Ducking, MD    Family History Family History  Problem Relation Age of Onset  . Asthma Sister        2 years younger  . Diabetes Sister        2 years younger  . Hypertension Father   . Rheum  arthritis Sister        4 years younger  . Kidney failure Mother        complications from surgery  . Colitis Mother   . Crohn's disease Maternal Aunt     Social History Social History   Tobacco Use  . Smoking status: Never Smoker  . Smokeless tobacco: Never Used  Substance Use Topics  . Alcohol use: Not Currently  . Drug use: No     Allergies   Cefpodoxime, Cheratussin ac [guaifenesin-codeine], Fiorinal [butalbital-aspirin-caffeine], and Other   Review of Systems Review of Systems  Musculoskeletal: Positive for arthralgias and joint swelling.  All other systems reviewed and are negative.    Physical Exam Updated Vital Signs BP (!) 131/102 (BP Location: Left Arm)   Pulse 65   Temp 98.6 F (37 C) (Oral)   Resp 18   LMP 03/31/1994   SpO2 100%   Physical Exam Vitals signs and nursing note reviewed.  Constitutional:      Appearance: Normal appearance.  HENT:     Head: Atraumatic.     Nose: Nose normal.     Mouth/Throat:     Pharynx: Oropharynx is clear.  Eyes:     Conjunctiva/sclera: Conjunctivae normal.  Neck:     Musculoskeletal: Normal range of motion.  Cardiovascular:     Rate and Rhythm: Normal rate and regular rhythm.  Pulmonary:     Effort: Pulmonary effort is normal.     Breath sounds: Normal breath sounds.  Abdominal:     Palpations: Abdomen is soft.  Musculoskeletal:        General: Swelling, tenderness and signs of injury present.     Right ankle: She exhibits swelling. She exhibits no deformity and normal pulse. Tenderness. Lateral malleolus tenderness found.       Feet:  Skin:    General: Skin is warm and dry.  Neurological:     Mental Status: She is alert and oriented to person, place, and time.  Psychiatric:        Mood and Affect: Mood normal.      ED Treatments / Results  Labs (all labs ordered are listed, but only abnormal results are displayed) Labs Reviewed - No data to display  EKG None  Radiology Dg Ankle Complete  Right  Result Date: 02/10/2019 CLINICAL DATA:  Fall. EXAM: RIGHT ANKLE - COMPLETE 3+ VIEW COMPARISON:  Right tibia and fibula x-rays dated October 09, 2016. FINDINGS: Acute minimally displaced oblique fracture of the distal fibula  with intra-articular extension. No additional fracture. No dislocation. The ankle mortise is symmetric. The talar dome is intact. Small tibiotalar joint effusion. Joint spaces are preserved. Lateral ankle soft tissue swelling. IMPRESSION: 1. Acute minimally displaced intra-articular fracture of the lateral malleolus with overlying soft tissue swelling. Electronically Signed   By: Titus Dubin M.D.   On: 02/10/2019 19:02    Procedures Procedures (including critical care time)  Medications Ordered in ED Medications - No data to display   Initial Impression / Assessment and Plan / ED Course  I have reviewed the triage vital signs and the nursing notes.  Pertinent labs & imaging results that were available during my care of the patient were reviewed by me and considered in my medical decision making (see chart for details).        Patient X-Ray positive for distal fibula fracture. Patient placed in posterior/jones splint and provided crutches. Patient is followed by Dr. Maureen Ralphs, and recommended for follow-up..  Patient will be discharged home & is agreeable with above plan. Returns precautions discussed. Pt appears safe for discharge.  Final Clinical Impressions(s) / ED Diagnoses   Final diagnoses:  Other closed fracture of distal end of right fibula, initial encounter    ED Discharge Orders    None       Etta Quill, NP 02/10/19 [id number]    Milton Ferguson, MD 02/11/19 [id number]

## 2019-02-15 MED FILL — LEVOCETIRIZINE 5 MG TABLET: 5 | 30 days supply | Qty: 30 | Fill #3

## 2019-02-17 MED FILL — AZELASTINE HCL 137 MCG SPRY: 0.1 | 25 days supply | Qty: 30 | Fill #1

## 2019-03-09 ENCOUNTER — Ambulatory Visit: Payer: Medicare Other | Admitting: Neurology

## 2019-03-15 MED FILL — LEVOCETIRIZINE 5 MG TABLET: 5 | 30 days supply | Qty: 30 | Fill #0

## 2019-03-21 ENCOUNTER — Ambulatory Visit: Payer: BLUE CROSS/BLUE SHIELD | Admitting: Neurology

## 2019-04-01 IMAGING — CR DG TIBIA/FIBULA 2V*L*
4 series · 4 of 4 positions shown · non-contrast
Comparison: None.

CLINICAL DATA: Intermittent bilateral lower extremity edema for
approximately 1 year.

EXAM:
LEFT TIBIA AND FIBULA - 2 VIEW

[t tib/fib ap left (1 of 2)]
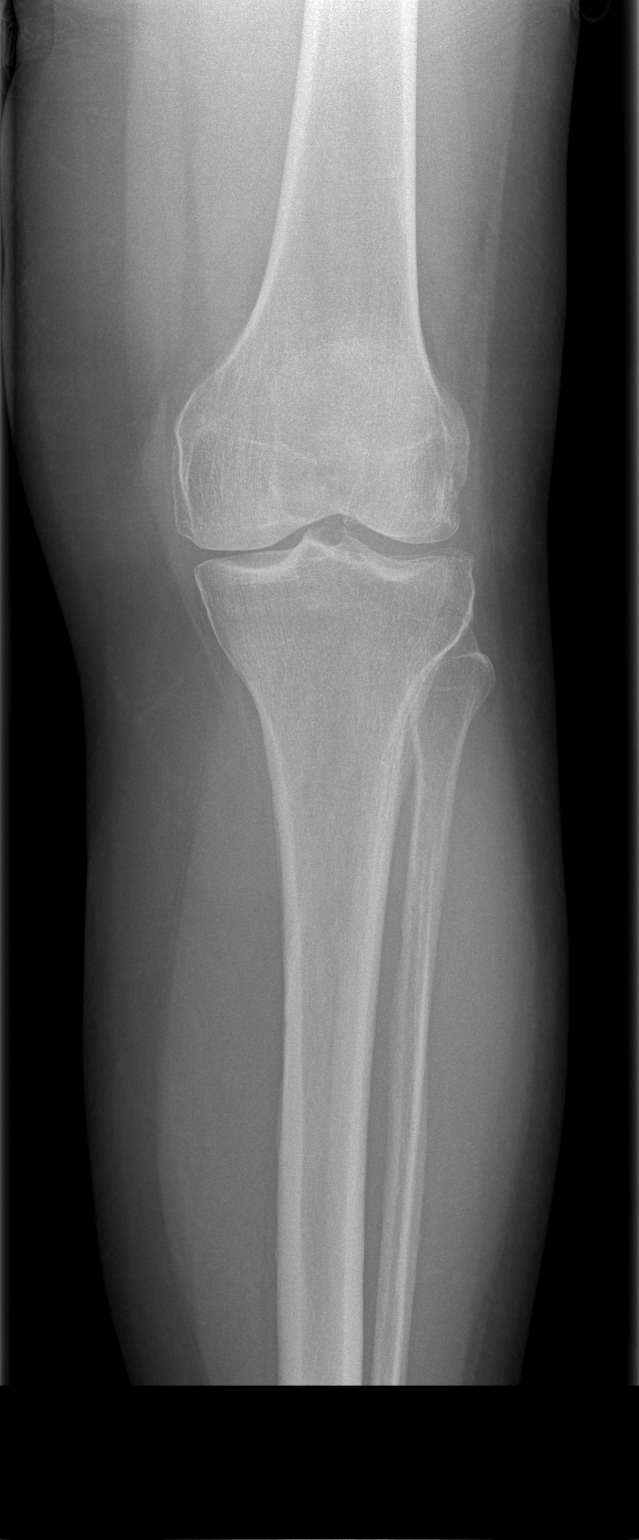

[t tib/fib ap left (2 of 2)]
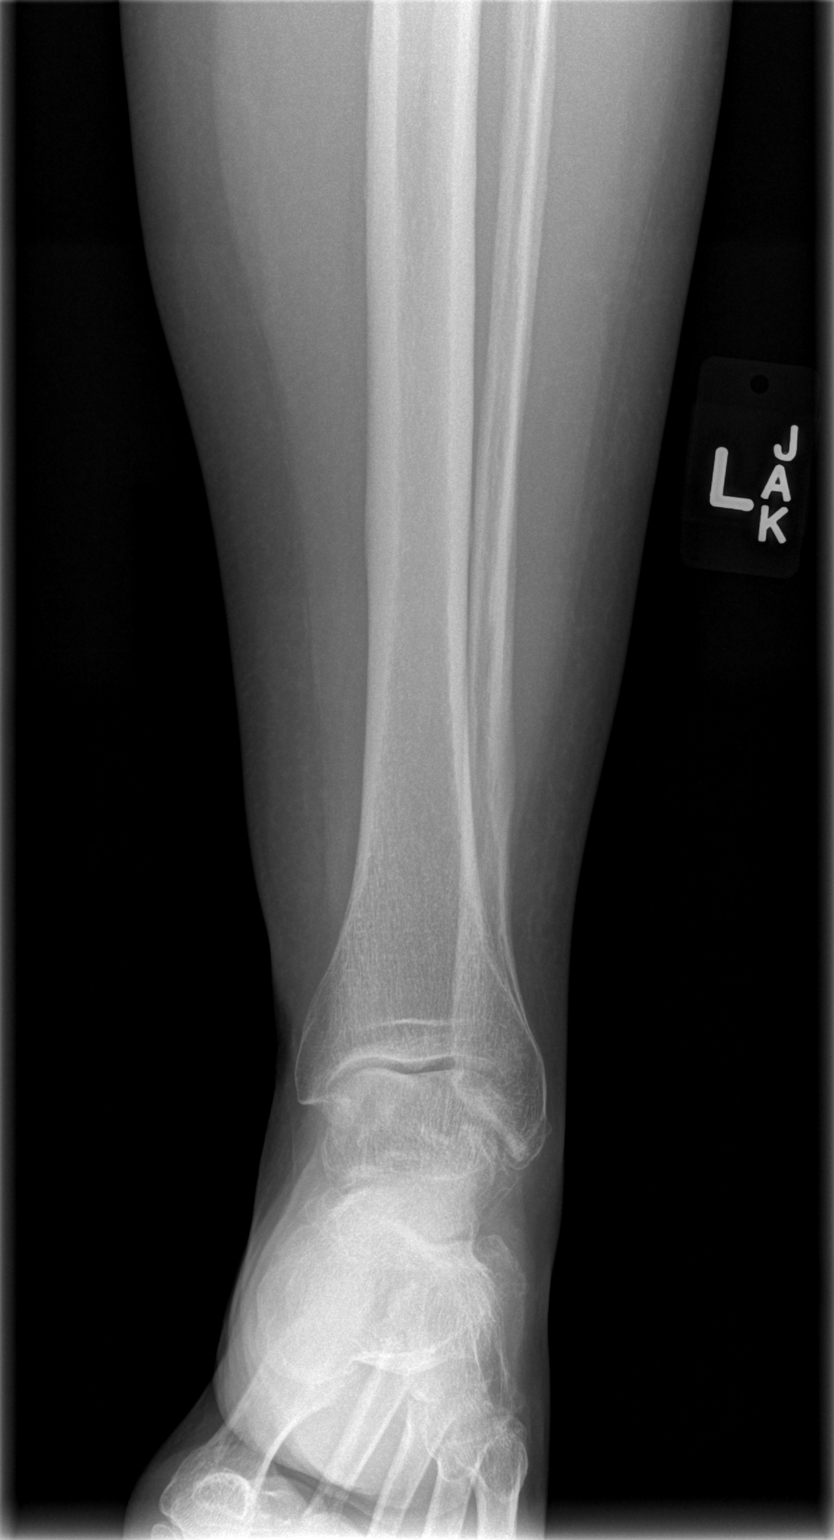

[t tib/fib lat left (1 of 2)]
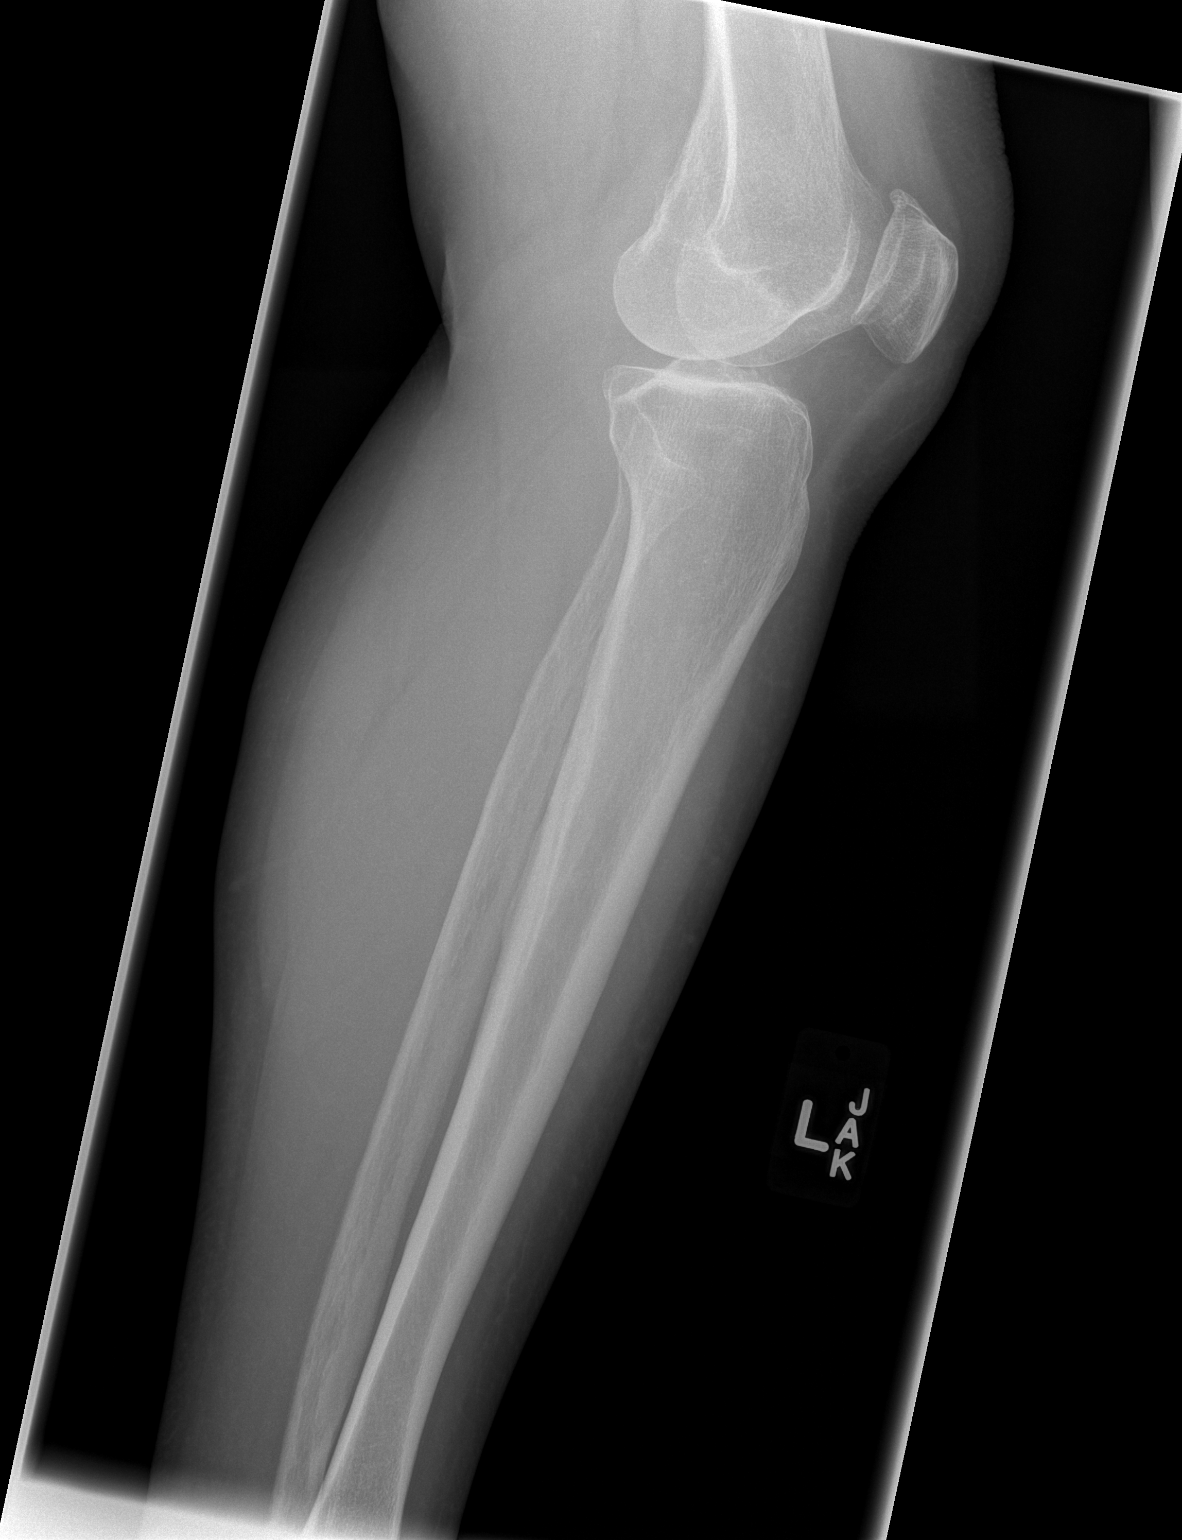

[t tib/fib lat left (2 of 2)]
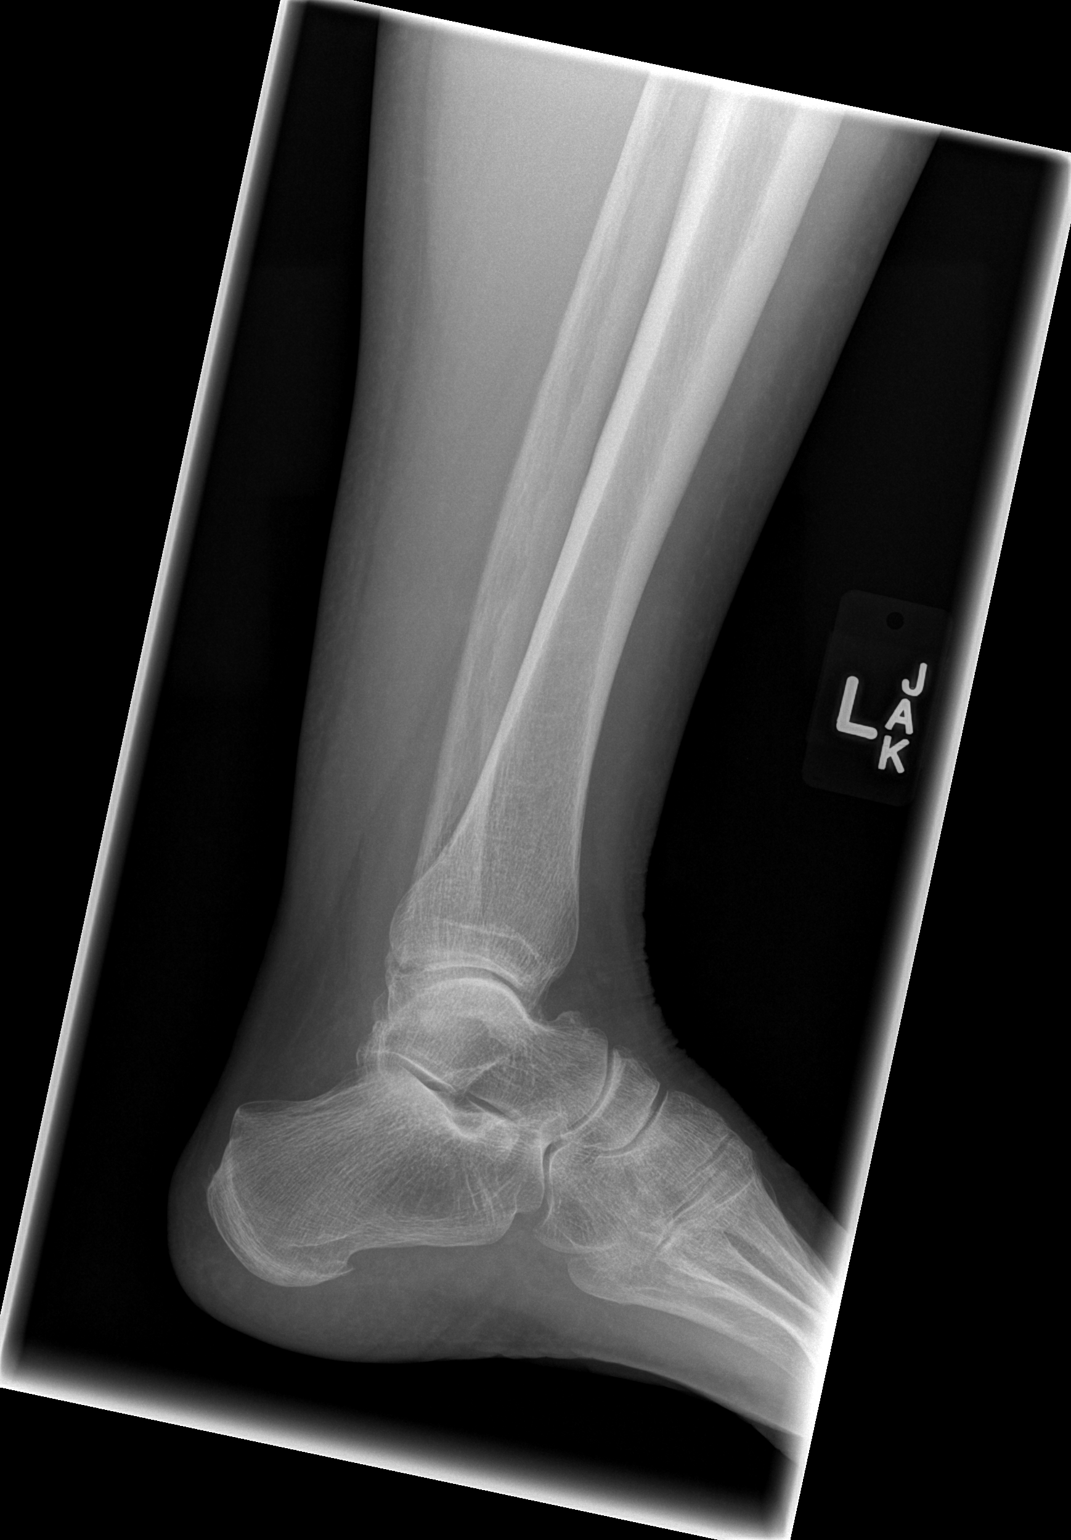

[4 of 4 positions shown; findings below may reference images not displayed]

FINDINGS: No evidence of acute or subacute fracture or dislocation. Mild
medial and lateral compartment joint space narrowing at the knee.
Mild narrowing of the tibiotalar joint space at the ankle.
IMPRESSION: No acute or subacute osseous abnormality. Mild osteoarthritis
involving the left knee and ankle joints.

## 2019-04-04 MED FILL — AZELASTINE HCL 137 MCG SPRY: 0.1 | 25 days supply | Qty: 30 | Fill #2

## 2019-04-12 MED FILL — LEVOCETIRIZINE 5 MG TABLET: 5 | 30 days supply | Qty: 30 | Fill #1

## 2019-04-24 ENCOUNTER — Ambulatory Visit: Payer: Medicare Other | Attending: Internal Medicine

## 2019-04-24 DIAGNOSIS — Z23 Encounter for immunization: Secondary | ICD-10-CM

## 2019-04-24 NOTE — Progress Notes (Signed)
   Covid-19 Vaccination Clinic  Name:  Susan Crosby    MRN: [id number] DOB: [birth year]  04/24/2019  [name] was observed post Covid-19 immunization for 15 minutes without incidence. She was provided with Vaccine Information Sheet and instruction to access the V-Safe system.   [name] was instructed to call 911 with any severe reactions post vaccine: Marland Kitchen Difficulty breathing  . Swelling of your face and throat  . A fast heartbeat  . A bad rash all over your body  . Dizziness and weakness    Immunizations Administered    Name Date Dose VIS Date Route   Pfizer COVID-19 Vaccine 04/24/2019 10:30 AM 0.3 mL 03/11/2019 Intramuscular   Manufacturer: [place]   Lot: [id number]   [place]: [id number]

## 2019-05-16 ENCOUNTER — Ambulatory Visit: Payer: Medicare Other | Attending: Internal Medicine

## 2019-05-16 DIAGNOSIS — Z23 Encounter for immunization: Secondary | ICD-10-CM | POA: Insufficient documentation

## 2019-05-16 MED FILL — LEVOCETIRIZINE 5 MG TABLET: 5 | 30 days supply | Qty: 30 | Fill #2

## 2019-05-16 MED FILL — AZELASTINE HCL 137 MCG SPRY: 0.1 | 25 days supply | Qty: 30 | Fill #3

## 2019-05-16 NOTE — Progress Notes (Signed)
   Covid-19 Vaccination Clinic  Name:  Susan Crosby    MRN: [id number] DOB: [birth year]  05/16/2019  [name] was observed post Covid-19 immunization for 15 minutes without incidence. She was provided with Vaccine Information Sheet and instruction to access the V-Safe system.   [name] was instructed to call 911 with any severe reactions post vaccine: Marland Kitchen Difficulty breathing  . Swelling of your face and throat  . A fast heartbeat  . A bad rash all over your body  . Dizziness and weakness    Immunizations Administered    Name Date Dose VIS Date Route   Pfizer COVID-19 Vaccine 05/16/2019  8:21 AM 0.3 mL 03/11/2019 Intramuscular   Manufacturer: [place]   Lot: [id number]   [place]: [id number]

## 2019-06-20 MED FILL — AZELASTINE HCL 137 MCG SPRY: 0.1 | 25 days supply | Qty: 30 | Fill #4

## 2019-06-20 MED FILL — LEVOCETIRIZINE DIHYDROCHLOR: 5 | 30 days supply | Qty: 30 | Fill #0

## 2019-06-22 MED FILL — SUMAtriptan SUCCINATE 100 M: 100 | 30 days supply | Qty: 10 | Fill #0

## 2019-06-22 MED FILL — CELECOXIB 200 MG CAP: 200 | 30 days supply | Qty: 30 | Fill #0

## 2019-07-05 ENCOUNTER — Other Ambulatory Visit (HOSPITAL_COMMUNITY): Payer: Self-pay | Admitting: Allergy and Immunology

## 2019-07-21 MED FILL — LEVOCETIRIZINE 5 MG TABLET: 5 | 30 days supply | Qty: 30 | Fill #1

## 2019-08-22 MED FILL — LEVOCETIRIZINE 5 MG TABLET: 5 | 30 days supply | Qty: 30 | Fill #0

## 2019-09-13 MED FILL — AZELASTINE HCL 137 MCG SPRY: 0.1 | 30 days supply | Qty: 30 | Fill #0

## 2019-09-20 ENCOUNTER — Other Ambulatory Visit (HOSPITAL_COMMUNITY): Payer: Self-pay | Admitting: Allergy and Immunology

## 2019-09-20 MED FILL — LEVOCETIRIZINE 5 MG TABLET: 5 | 30 days supply | Qty: 30 | Fill #0

## 2019-10-20 MED FILL — LEVOCETIRIZINE 5 MG TABLET: 5 | 30 days supply | Qty: 30 | Fill #1

## 2019-10-31 MED FILL — AZELASTINE HCL 137 MCG SPRY: 0.1 | 30 days supply | Qty: 30 | Fill #1

## 2019-11-21 MED FILL — LEVOCETIRIZINE 5 MG TABLET: 5 | 30 days supply | Qty: 30 | Fill #2

## 2019-12-20 MED FILL — LEVOCETIRIZINE 5 MG TABLET: 5 | 30 days supply | Qty: 30 | Fill #3

## 2019-12-20 MED FILL — AZELASTINE HCL 137 MCG SPRY: 0.1 | 30 days supply | Qty: 30 | Fill #2

## 2020-01-03 ENCOUNTER — Ambulatory Visit: Payer: Medicare Other | Attending: Internal Medicine

## 2020-01-03 DIAGNOSIS — Z23 Encounter for immunization: Secondary | ICD-10-CM

## 2020-01-03 NOTE — Progress Notes (Signed)
   Covid-19 Vaccination Clinic  Name:  Susan Crosby    MRN: [id number] DOB: 06/11/1955  01/03/2020  Susan Crosby was observed post Covid-19 immunization for 15 minutes without incident. She was provided with Vaccine Information Sheet and instruction to access the V-Safe system.   Susan Crosby was instructed to call 911 with any severe reactions post vaccine: Marland Kitchen Difficulty breathing  . Swelling of face and throat  . A fast heartbeat  . A bad rash all over body  . Dizziness and weakness

## 2020-01-16 MED FILL — LEVOCETIRIZINE 5 MG TABLET: 5 | 30 days supply | Qty: 30 | Fill #4

## 2020-02-01 MED FILL — AZELASTINE HCL 137 MCG SPRY: 0.1 | 30 days supply | Qty: 30 | Fill #3

## 2020-02-20 MED FILL — LEVOCETIRIZINE 5 MG TABLET: 5 | 30 days supply | Qty: 30 | Fill #5

## 2020-03-19 MED FILL — AZELASTINE HCL 137 MCG SPRY: 0.1 | 30 days supply | Qty: 30 | Fill #4

## 2020-03-19 MED FILL — LEVOCETIRIZINE 5 MG TABLET: 5 | 30 days supply | Qty: 30 | Fill #0

## 2020-04-02 ENCOUNTER — Other Ambulatory Visit: Payer: Self-pay

## 2020-04-02 ENCOUNTER — Encounter: Payer: Self-pay | Admitting: Obstetrics & Gynecology

## 2020-04-02 ENCOUNTER — Ambulatory Visit (INDEPENDENT_AMBULATORY_CARE_PROVIDER_SITE_OTHER): Payer: Medicare Other | Admitting: Obstetrics & Gynecology

## 2020-04-02 VITALS — BP 149/83 | HR 73 | Ht 63.0 in | Wt 147.0 lb

## 2020-04-02 DIAGNOSIS — Z01419 Encounter for gynecological examination (general) (routine) without abnormal findings: Secondary | ICD-10-CM | POA: Diagnosis not present

## 2020-04-02 DIAGNOSIS — M8580 Other specified disorders of bone density and structure, unspecified site: Secondary | ICD-10-CM | POA: Diagnosis not present

## 2020-04-02 NOTE — Progress Notes (Signed)
Last mammo 2021, due in the Spring   Bone density in spring as well   colonoscopy due this year,

## 2020-04-02 NOTE — Progress Notes (Signed)
65 y.o. [place] Married White or Caucasian female here for breast and pelvic exam.  Denies vaginal bleeding.  Doing well.  Reports she did have her MMG last year.  This was at [facility].  Will have BMD done later this year.  H/O osteoporosis that I have been following.  Does not need order for this right now.    Has dermatology appointment.  Using fluorouracil topically.  PCP:  Dr. Inda Merlin.  Appt will be done in the spring.  Pt feels like she's had Hep C testing but does not have access to lab work right now.  Will check with Dr. Inda Merlin at next visit.  Patient's last menstrual period was 03/31/1994.           Health Maintenance: Pap:  2015, h/o TVH History of abnormal Pap:  no MMG:  2021 at [facility].  I do not have this in her records.  Will have release signed. Colonoscopy:  2013.  Was on every 5 year follow up.  Dr. Cristina Gong.  Last appt this was changed to 10 year follow up. BMD:   Scheduled for later this year TDaP:  2013   reports that she has never smoked. She has never used smokeless tobacco. She reports previous alcohol use. She reports that she does not use drugs.  Past Medical History:  Diagnosis Date  . Ankle fracture, left 04/06/11  . Arthritis    in neck  . Asthma 1993  . History of migraine headaches   . IBS (irritable bowel syndrome)   . Meniscus tear    patella spurs, arthritis-having steroid injections  . Migraine without aura, without mention of intractable migraine without mention of status migrainosus 03/09/2013  . Osteopenia    mild  . Precancerous lesion 03/2016  . Wrist fracture 2007   pisiform     x3    Past Surgical History:  Procedure Laterality Date  . CATARACT EXTRACTION Bilateral   . DIAGNOSTIC LAPAROSCOPY  10/96   Endometriosis  . FRACTURE SURGERY Left 2007   ulner/radius  . LIPOSUCTION  2001  . NOSE SURGERY  1987  . VAGINAL HYSTERECTOMY  11/96   Dysmenorrhea-TVH    Current Outpatient Medications  Medication Sig Dispense Refill  . albuterol (VENTOLIN  HFA) 108 (90 Base) MCG/ACT inhaler Inhale 1-2 puffs into the lungs. 15 min prior to exercise of every 4 hours as needed.  Use with spacer.    Marland Kitchen azelastine (ASTELIN) 0.1 % nasal spray Place 1 spray into both nostrils 2 (two) times daily. Use in each nostril as directed    . Calcium Carbonate (CALCIUM 600 PO) Take by mouth.    . CELEBREX 200 MG capsule TAKE 1 CAPSULE BY MOUTH 2 TIMES DAILY AS NEEDED. 180 capsule 0  . Cholecalciferol (VITAMIN D3 PO) Take 1,600 mg by mouth daily.    . fluorouracil (EFUDEX) 5 % cream Apply 1 Dose topically as needed.   0  . levocetirizine (XYZAL) 5 MG tablet Take 5 mg by mouth every evening.    . saccharomyces boulardii (FLORASTOR) 250 MG capsule Take 250 mg by mouth daily.     Marland Kitchen Spacer/Aero-Holding Chambers (AEROCHAMBER MV) inhaler Use as instructed    . SUMAtriptan (IMITREX) 100 MG tablet Take 0.5 tablets (50 mg total) by mouth 2 (two) times daily as needed for migraine or headache. May repeat in 2 hours if headache persists or recurs. Pt take may take 1/2 of pill 27 tablet 3  . mupirocin ointment (BACTROBAN) 2 % Place 1  application into the nose as needed. (Patient not taking: Reported on 04/02/2020)     No current facility-administered medications for this visit.    Family History  Problem Relation Age of Onset  . Asthma Sister        2 years younger  . Diabetes Sister        2 years younger  . Hypertension Father   . Rheum arthritis Sister        4 years younger  . Kidney failure Mother        complications from surgery  . Colitis Mother   . Crohn's disease Maternal Aunt     Review of Systems  Constitutional: Negative.   Gastrointestinal: Negative.   Genitourinary: Negative.   Musculoskeletal: Negative.   Psychiatric/Behavioral: Positive for dysphoric mood.    Exam:   BP (!) 149/83   Pulse 73   Ht 5\' 3"  (1.6 m)   Wt 147 lb (66.7 kg)   LMP 03/31/1994   BMI 26.04 kg/m   Height: 5\' 3"  (160 cm)  General appearance: alert, cooperative and  appears stated age Head: Normocephalic, without obvious abnormality, atraumatic Breasts: normal appearance, no masses or tenderness Heart: regular rate and rhythm Abdomen: soft, non-tender; bowel sounds normal; no masses,  no organomegaly Extremities: extremities normal, atraumatic, no cyanosis or edema Skin: Skin color, texture, turgor normal. No rashes or lesions Lymph nodes: Cervical, supraclavicular, and axillary nodes normal. No abnormal inguinal nodes palpated Neurologic: Grossly normal  Pelvic: External genitalia:  no lesions              Urethra:  normal appearing urethra with no masses, tenderness or lesions              Bartholins and Skenes: normal                 Vagina: normal appearing vagina with normal color and discharge, no lesions              Cervix: absent              Pap taken: No. Bimanual Exam:  Uterus:  uterus absent              Adnexa: normal adnexa and no mass, fullness, tenderness               Rectovaginal: Confirms               Anus:  normal sphincter tone, no lesions  Chaperone was present for exam.  Assessment/Plan:   1. Encounter for gynecological examination -Pap smear not indicated due to h/o [facility] -Release signed for MMG from [facility] for 2021 -vaccines reviewed -lab work done with [facility], appt scheduled in March -colonoscopy due 2021 -we discussed pt coming for appts on prn basis or every other year.  She desires to come back in 2 years.   2. Osteopenia, unspecified location -BMD will be done later this year.  Does not need order at this time.  25 minutes of total time was spent for this patient encounter, including preparation, face-to-face counseling with the patient and coordination of care, and documentation of the encounter.

## 2020-04-05 ENCOUNTER — Encounter: Payer: Self-pay | Admitting: Radiology

## 2020-04-10 ENCOUNTER — Ambulatory Visit: Payer: Medicare Other

## 2020-04-10 DIAGNOSIS — Z23 Encounter for immunization: Secondary | ICD-10-CM | POA: Diagnosis not present

## 2020-04-11 DIAGNOSIS — L570 Actinic keratosis: Secondary | ICD-10-CM | POA: Diagnosis not present

## 2020-04-11 DIAGNOSIS — D485 Neoplasm of uncertain behavior of skin: Secondary | ICD-10-CM | POA: Diagnosis not present

## 2020-04-11 DIAGNOSIS — B078 Other viral warts: Secondary | ICD-10-CM | POA: Diagnosis not present

## 2020-04-19 ENCOUNTER — Other Ambulatory Visit (HOSPITAL_COMMUNITY): Payer: Self-pay | Admitting: Internal Medicine

## 2020-04-19 DIAGNOSIS — R030 Elevated blood-pressure reading, without diagnosis of hypertension: Secondary | ICD-10-CM | POA: Diagnosis not present

## 2020-04-19 MED FILL — LEVOCETIRIZINE 5 MG TABLET: 5 | 30 days supply | Qty: 30 | Fill #0

## 2020-04-19 MED FILL — VALSARTAN-HCTZ 80-12.5 MG T: 80-12.5 | 90 days supply | Qty: 90 | Fill #0

## 2020-05-10 MED FILL — AZELASTINE HCL 137 MCG SPRY: 0.1 | 30 days supply | Qty: 30 | Fill #5

## 2020-05-21 MED FILL — LEVOCETIRIZINE 5 MG TABLET: 5 | 30 days supply | Qty: 30 | Fill #1

## 2020-06-18 DIAGNOSIS — K219 Gastro-esophageal reflux disease without esophagitis: Secondary | ICD-10-CM | POA: Diagnosis not present

## 2020-06-18 DIAGNOSIS — E785 Hyperlipidemia, unspecified: Secondary | ICD-10-CM | POA: Diagnosis not present

## 2020-06-18 DIAGNOSIS — R011 Cardiac murmur, unspecified: Secondary | ICD-10-CM | POA: Diagnosis not present

## 2020-06-18 DIAGNOSIS — R030 Elevated blood-pressure reading, without diagnosis of hypertension: Secondary | ICD-10-CM | POA: Diagnosis not present

## 2020-06-18 DIAGNOSIS — G43909 Migraine, unspecified, not intractable, without status migrainosus: Secondary | ICD-10-CM | POA: Diagnosis not present

## 2020-06-18 MED FILL — LEVOCETIRIZINE 5 MG TABLET: 5 | 30 days supply | Qty: 30 | Fill #2

## 2020-06-22 DIAGNOSIS — R011 Cardiac murmur, unspecified: Secondary | ICD-10-CM | POA: Diagnosis not present

## 2020-07-03 DIAGNOSIS — J45909 Unspecified asthma, uncomplicated: Secondary | ICD-10-CM | POA: Diagnosis not present

## 2020-07-03 DIAGNOSIS — J310 Chronic rhinitis: Secondary | ICD-10-CM | POA: Diagnosis not present

## 2020-07-03 DIAGNOSIS — I10 Essential (primary) hypertension: Secondary | ICD-10-CM | POA: Diagnosis not present

## 2020-07-03 DIAGNOSIS — E785 Hyperlipidemia, unspecified: Secondary | ICD-10-CM | POA: Diagnosis not present

## 2020-07-03 DIAGNOSIS — H269 Unspecified cataract: Secondary | ICD-10-CM | POA: Diagnosis not present

## 2020-07-03 DIAGNOSIS — E559 Vitamin D deficiency, unspecified: Secondary | ICD-10-CM | POA: Diagnosis not present

## 2020-07-03 DIAGNOSIS — Z79899 Other long term (current) drug therapy: Secondary | ICD-10-CM | POA: Diagnosis not present

## 2020-07-03 DIAGNOSIS — K580 Irritable bowel syndrome with diarrhea: Secondary | ICD-10-CM | POA: Diagnosis not present

## 2020-07-03 DIAGNOSIS — M179 Osteoarthritis of knee, unspecified: Secondary | ICD-10-CM | POA: Diagnosis not present

## 2020-07-03 DIAGNOSIS — Z0000 Encounter for general adult medical examination without abnormal findings: Secondary | ICD-10-CM | POA: Diagnosis not present

## 2020-07-03 DIAGNOSIS — H9313 Tinnitus, bilateral: Secondary | ICD-10-CM | POA: Diagnosis not present

## 2020-07-05 ENCOUNTER — Other Ambulatory Visit (HOSPITAL_COMMUNITY): Payer: Self-pay

## 2020-07-05 DIAGNOSIS — J3089 Other allergic rhinitis: Secondary | ICD-10-CM | POA: Diagnosis not present

## 2020-07-05 DIAGNOSIS — T781XXD Other adverse food reactions, not elsewhere classified, subsequent encounter: Secondary | ICD-10-CM | POA: Diagnosis not present

## 2020-07-05 DIAGNOSIS — J4520 Mild intermittent asthma, uncomplicated: Secondary | ICD-10-CM | POA: Diagnosis not present

## 2020-07-05 DIAGNOSIS — J301 Allergic rhinitis due to pollen: Secondary | ICD-10-CM | POA: Diagnosis not present

## 2020-07-05 MED ORDER — LEVOCETIRIZINE DIHYDROCHLORIDE 5 MG PO TABS
5.0000 mg | ORAL_TABLET | Freq: Every day | ORAL | 5 refills | Status: DC
  Filled 2020-07-05 – 2020-07-17 (×2): qty 30, 30d supply, fill #0
  Filled 2020-08-14: qty 30, 30d supply, fill #1
  Filled 2020-09-17: qty 30, 30d supply, fill #2
  Filled 2020-10-19: qty 30, 30d supply, fill #3
  Filled 2020-11-20: qty 30, 30d supply, fill #4
  Filled 2020-12-19: qty 30, 30d supply, fill #5

## 2020-07-09 ENCOUNTER — Other Ambulatory Visit (HOSPITAL_COMMUNITY): Payer: Self-pay

## 2020-07-09 ENCOUNTER — Other Ambulatory Visit (HOSPITAL_COMMUNITY): Payer: Self-pay | Admitting: Allergy and Immunology

## 2020-07-10 ENCOUNTER — Other Ambulatory Visit (HOSPITAL_COMMUNITY): Payer: Self-pay

## 2020-07-10 ENCOUNTER — Other Ambulatory Visit (HOSPITAL_COMMUNITY): Payer: Self-pay | Admitting: Allergy and Immunology

## 2020-07-10 MED ORDER — AZELASTINE HCL 0.1 % NA SOLN
1.0000 | Freq: Two times a day (BID) | NASAL | 3 refills | Status: DC | PRN
Start: 2020-07-10 — End: 2021-01-29
  Filled 2020-07-10: qty 30, 25d supply, fill #0
  Filled 2020-08-22: qty 30, 25d supply, fill #1
  Filled 2020-10-11: qty 30, 25d supply, fill #2
  Filled 2020-12-04: qty 30, 25d supply, fill #3

## 2020-07-13 ENCOUNTER — Other Ambulatory Visit (HOSPITAL_COMMUNITY): Payer: Self-pay

## 2020-07-17 ENCOUNTER — Other Ambulatory Visit (HOSPITAL_COMMUNITY): Payer: Self-pay

## 2020-07-17 DIAGNOSIS — M8589 Other specified disorders of bone density and structure, multiple sites: Secondary | ICD-10-CM | POA: Diagnosis not present

## 2020-07-17 DIAGNOSIS — Z1231 Encounter for screening mammogram for malignant neoplasm of breast: Secondary | ICD-10-CM | POA: Diagnosis not present

## 2020-08-09 ENCOUNTER — Other Ambulatory Visit (HOSPITAL_BASED_OUTPATIENT_CLINIC_OR_DEPARTMENT_OTHER): Payer: Self-pay

## 2020-08-09 ENCOUNTER — Ambulatory Visit: Payer: Medicare Other | Attending: Internal Medicine

## 2020-08-09 ENCOUNTER — Other Ambulatory Visit: Payer: Self-pay

## 2020-08-09 DIAGNOSIS — Z23 Encounter for immunization: Secondary | ICD-10-CM

## 2020-08-09 MED ORDER — PFIZER-BIONT COVID-19 VAC-TRIS 30 MCG/0.3ML IM SUSP
INTRAMUSCULAR | 0 refills | Status: AC
  Filled 2020-08-09: qty 0.3, 1d supply, fill #0

## 2020-08-09 NOTE — Progress Notes (Signed)
   Covid-19 Vaccination Clinic  Name:  Lismary Kiehn    MRN: [id number] DOB: 1955-11-18  08/09/2020  Ms. Biddle was observed post Covid-19 immunization for 15 minutes without incident. She was provided with Vaccine Information Sheet and instruction to access the V-Safe system.   Ms. Reichow was instructed to call 911 with any severe reactions post vaccine: Marland Kitchen Difficulty breathing  . Swelling of face and throat  . A fast heartbeat  . A bad rash all over body  . Dizziness and weakness   Immunizations Administered    Name Date Dose VIS Date Route   PFIZER Comrnaty([place] TOP) Covid-19 Vaccine 08/09/2020 10:18 AM 0.3 mL 03/08/2020 Intramuscular   Manufacturer: [employer]   Lot: [id number]   [place]: [id number]

## 2020-08-14 ENCOUNTER — Other Ambulatory Visit (HOSPITAL_COMMUNITY): Payer: Self-pay

## 2020-08-15 ENCOUNTER — Other Ambulatory Visit (HOSPITAL_COMMUNITY): Payer: Self-pay

## 2020-08-21 DIAGNOSIS — H524 Presbyopia: Secondary | ICD-10-CM | POA: Diagnosis not present

## 2020-08-21 DIAGNOSIS — H26493 Other secondary cataract, bilateral: Secondary | ICD-10-CM | POA: Diagnosis not present

## 2020-08-21 DIAGNOSIS — H43813 Vitreous degeneration, bilateral: Secondary | ICD-10-CM | POA: Diagnosis not present

## 2020-08-22 ENCOUNTER — Other Ambulatory Visit (HOSPITAL_COMMUNITY): Payer: Self-pay

## 2020-09-06 ENCOUNTER — Other Ambulatory Visit (HOSPITAL_COMMUNITY): Payer: Self-pay

## 2020-09-06 MED FILL — Valsartan-Hydrochlorothiazide Tab 80-12.5 MG: ORAL | 90 days supply | Qty: 90 | Fill #0 | Status: AC

## 2020-09-17 ENCOUNTER — Other Ambulatory Visit (HOSPITAL_COMMUNITY): Payer: Self-pay

## 2020-10-02 DIAGNOSIS — E785 Hyperlipidemia, unspecified: Secondary | ICD-10-CM | POA: Diagnosis not present

## 2020-10-04 ENCOUNTER — Other Ambulatory Visit (HOSPITAL_COMMUNITY): Payer: Self-pay

## 2020-10-04 MED ORDER — ROSUVASTATIN CALCIUM 5 MG PO TABS
5.0000 mg | ORAL_TABLET | Freq: Every day | ORAL | 1 refills | Status: DC
  Filled 2020-10-04: qty 30, 30d supply, fill #0
  Filled 2020-11-01: qty 30, 30d supply, fill #1

## 2020-10-11 ENCOUNTER — Other Ambulatory Visit (HOSPITAL_COMMUNITY): Payer: Self-pay

## 2020-10-19 ENCOUNTER — Other Ambulatory Visit (HOSPITAL_COMMUNITY): Payer: Self-pay

## 2020-10-25 DIAGNOSIS — L309 Dermatitis, unspecified: Secondary | ICD-10-CM | POA: Diagnosis not present

## 2020-10-25 DIAGNOSIS — L570 Actinic keratosis: Secondary | ICD-10-CM | POA: Diagnosis not present

## 2020-11-01 ENCOUNTER — Other Ambulatory Visit (HOSPITAL_COMMUNITY): Payer: Self-pay

## 2020-11-12 DIAGNOSIS — E785 Hyperlipidemia, unspecified: Secondary | ICD-10-CM | POA: Diagnosis not present

## 2020-11-15 DIAGNOSIS — J4520 Mild intermittent asthma, uncomplicated: Secondary | ICD-10-CM | POA: Diagnosis not present

## 2020-11-15 DIAGNOSIS — J301 Allergic rhinitis due to pollen: Secondary | ICD-10-CM | POA: Diagnosis not present

## 2020-11-15 DIAGNOSIS — J3089 Other allergic rhinitis: Secondary | ICD-10-CM | POA: Diagnosis not present

## 2020-11-15 DIAGNOSIS — T781XXD Other adverse food reactions, not elsewhere classified, subsequent encounter: Secondary | ICD-10-CM | POA: Diagnosis not present

## 2020-11-20 ENCOUNTER — Other Ambulatory Visit (HOSPITAL_COMMUNITY): Payer: Self-pay

## 2020-11-28 ENCOUNTER — Other Ambulatory Visit (HOSPITAL_COMMUNITY): Payer: Self-pay

## 2020-11-28 MED ORDER — VALSARTAN-HYDROCHLOROTHIAZIDE 80-12.5 MG PO TABS
1.0000 | ORAL_TABLET | Freq: Every day | ORAL | 3 refills | Status: DC
  Filled 2020-11-28: qty 90, 90d supply, fill #0
  Filled 2021-08-09: qty 90, 90d supply, fill #1

## 2020-11-28 MED ORDER — ROSUVASTATIN CALCIUM 5 MG PO TABS
5.0000 mg | ORAL_TABLET | Freq: Every day | ORAL | 3 refills | Status: DC
  Filled 2020-11-28: qty 90, 90d supply, fill #0
  Filled 2021-02-27: qty 90, 90d supply, fill #1
  Filled 2021-05-20: qty 90, 90d supply, fill #2
  Filled 2021-08-21: qty 90, 90d supply, fill #3

## 2020-11-28 MED ORDER — VALSARTAN-HYDROCHLOROTHIAZIDE 80-12.5 MG PO TABS: 1.0000 | ORAL_TABLET | Freq: Every day | ORAL | 4 refills | Status: DC

## 2020-12-04 ENCOUNTER — Other Ambulatory Visit (HOSPITAL_COMMUNITY): Payer: Self-pay

## 2020-12-04 DIAGNOSIS — R197 Diarrhea, unspecified: Secondary | ICD-10-CM | POA: Diagnosis not present

## 2020-12-07 DIAGNOSIS — R197 Diarrhea, unspecified: Secondary | ICD-10-CM | POA: Diagnosis not present

## 2020-12-19 ENCOUNTER — Other Ambulatory Visit (HOSPITAL_COMMUNITY): Payer: Self-pay

## 2020-12-27 DIAGNOSIS — K580 Irritable bowel syndrome with diarrhea: Secondary | ICD-10-CM | POA: Diagnosis not present

## 2020-12-27 DIAGNOSIS — K648 Other hemorrhoids: Secondary | ICD-10-CM | POA: Diagnosis not present

## 2021-01-20 ENCOUNTER — Other Ambulatory Visit (HOSPITAL_COMMUNITY): Payer: Self-pay

## 2021-01-21 ENCOUNTER — Other Ambulatory Visit (HOSPITAL_BASED_OUTPATIENT_CLINIC_OR_DEPARTMENT_OTHER): Payer: Self-pay

## 2021-01-21 ENCOUNTER — Other Ambulatory Visit: Payer: Self-pay

## 2021-01-21 ENCOUNTER — Other Ambulatory Visit (HOSPITAL_COMMUNITY): Payer: Self-pay

## 2021-01-21 ENCOUNTER — Ambulatory Visit: Payer: Medicare Other | Attending: Internal Medicine

## 2021-01-21 DIAGNOSIS — Z23 Encounter for immunization: Secondary | ICD-10-CM

## 2021-01-21 MED ORDER — LEVOCETIRIZINE DIHYDROCHLORIDE 5 MG PO TABS
5.0000 mg | ORAL_TABLET | Freq: Every evening | ORAL | 5 refills | Status: DC
  Filled 2021-01-21: qty 30, 30d supply, fill #0
  Filled 2021-02-18: qty 30, 30d supply, fill #1
  Filled 2021-03-18: qty 30, 30d supply, fill #2
  Filled 2021-04-18: qty 30, 30d supply, fill #3
  Filled 2021-05-20: qty 30, 30d supply, fill #4
  Filled 2021-06-18: qty 30, 30d supply, fill #5

## 2021-01-21 MED ORDER — PFIZER COVID-19 VAC BIVALENT 30 MCG/0.3ML IM SUSP
INTRAMUSCULAR | 0 refills | Status: AC
  Filled 2021-01-21: qty 0.3, 1d supply, fill #0

## 2021-01-21 NOTE — Progress Notes (Signed)
   Covid-19 Vaccination Clinic  Name:  Susan Crosby    MRN: [id number] DOB: [birth year]  01/21/2021  [name] was observed post Covid-19 immunization for 15 minutes without incident. She was provided with Vaccine Information Sheet and instruction to access the V-Safe system.   [name] was instructed to call 911 with any severe reactions post vaccine: Difficulty breathing  Swelling of face and throat  A fast heartbeat  A bad rash all over body  Dizziness and weakness   Immunizations Administered     Name Date Dose VIS Date Route   Pfizer Covid-19 Vaccine Bivalent Booster 01/21/2021  9:58 AM 0.3 mL 11/28/2020 Intramuscular   Manufacturer: [place]   Lot: [id number]   [place]: [id number]

## 2021-01-29 ENCOUNTER — Other Ambulatory Visit (HOSPITAL_COMMUNITY): Payer: Self-pay

## 2021-01-29 MED ORDER — AZELASTINE HCL 0.1 % NA SOLN
1.0000 | Freq: Two times a day (BID) | NASAL | 5 refills | Status: AC | PRN
  Filled 2021-01-29: qty 30, 25d supply, fill #0

## 2021-02-04 ENCOUNTER — Other Ambulatory Visit (HOSPITAL_BASED_OUTPATIENT_CLINIC_OR_DEPARTMENT_OTHER): Payer: Self-pay

## 2021-02-04 MED ORDER — INFLUENZA VAC A&B SA ADJ QUAD 0.5 ML IM PRSY
PREFILLED_SYRINGE | INTRAMUSCULAR | 0 refills | Status: AC
  Filled 2021-02-04: qty 0.5, 1d supply, fill #0

## 2021-02-18 ENCOUNTER — Other Ambulatory Visit (HOSPITAL_COMMUNITY): Payer: Self-pay

## 2021-02-27 ENCOUNTER — Other Ambulatory Visit (HOSPITAL_COMMUNITY): Payer: Self-pay

## 2021-03-18 ENCOUNTER — Other Ambulatory Visit (HOSPITAL_COMMUNITY): Payer: Self-pay

## 2021-03-21 ENCOUNTER — Other Ambulatory Visit (HOSPITAL_COMMUNITY): Payer: Self-pay

## 2021-03-22 ENCOUNTER — Other Ambulatory Visit (HOSPITAL_COMMUNITY): Payer: Self-pay

## 2021-03-26 ENCOUNTER — Other Ambulatory Visit (HOSPITAL_COMMUNITY): Payer: Self-pay

## 2021-04-11 DIAGNOSIS — L821 Other seborrheic keratosis: Secondary | ICD-10-CM | POA: Diagnosis not present

## 2021-04-11 DIAGNOSIS — L570 Actinic keratosis: Secondary | ICD-10-CM | POA: Diagnosis not present

## 2021-04-18 ENCOUNTER — Other Ambulatory Visit (HOSPITAL_COMMUNITY): Payer: Self-pay

## 2021-05-20 ENCOUNTER — Other Ambulatory Visit (HOSPITAL_COMMUNITY): Payer: Self-pay

## 2021-06-18 ENCOUNTER — Other Ambulatory Visit (HOSPITAL_COMMUNITY): Payer: Self-pay

## 2021-07-17 DIAGNOSIS — E785 Hyperlipidemia, unspecified: Secondary | ICD-10-CM | POA: Diagnosis not present

## 2021-07-17 DIAGNOSIS — E559 Vitamin D deficiency, unspecified: Secondary | ICD-10-CM | POA: Diagnosis not present

## 2021-07-17 DIAGNOSIS — Z0001 Encounter for general adult medical examination with abnormal findings: Secondary | ICD-10-CM | POA: Diagnosis not present

## 2021-07-17 DIAGNOSIS — M179 Osteoarthritis of knee, unspecified: Secondary | ICD-10-CM | POA: Diagnosis not present

## 2021-07-17 DIAGNOSIS — Z79899 Other long term (current) drug therapy: Secondary | ICD-10-CM | POA: Diagnosis not present

## 2021-07-17 DIAGNOSIS — J45909 Unspecified asthma, uncomplicated: Secondary | ICD-10-CM | POA: Diagnosis not present

## 2021-07-17 DIAGNOSIS — H9313 Tinnitus, bilateral: Secondary | ICD-10-CM | POA: Diagnosis not present

## 2021-07-17 DIAGNOSIS — K580 Irritable bowel syndrome with diarrhea: Secondary | ICD-10-CM | POA: Diagnosis not present

## 2021-07-17 DIAGNOSIS — H269 Unspecified cataract: Secondary | ICD-10-CM | POA: Diagnosis not present

## 2021-07-17 DIAGNOSIS — G43909 Migraine, unspecified, not intractable, without status migrainosus: Secondary | ICD-10-CM | POA: Diagnosis not present

## 2021-07-17 DIAGNOSIS — R011 Cardiac murmur, unspecified: Secondary | ICD-10-CM | POA: Diagnosis not present

## 2021-07-17 DIAGNOSIS — R413 Other amnesia: Secondary | ICD-10-CM | POA: Diagnosis not present

## 2021-07-17 DIAGNOSIS — I10 Essential (primary) hypertension: Secondary | ICD-10-CM | POA: Diagnosis not present

## 2021-07-23 ENCOUNTER — Other Ambulatory Visit (HOSPITAL_COMMUNITY): Payer: Self-pay

## 2021-07-23 DIAGNOSIS — Z1231 Encounter for screening mammogram for malignant neoplasm of breast: Secondary | ICD-10-CM | POA: Diagnosis not present

## 2021-07-23 MED ORDER — LEVOCETIRIZINE DIHYDROCHLORIDE 5 MG PO TABS
5.0000 mg | ORAL_TABLET | Freq: Every evening | ORAL | 3 refills | Status: DC
  Filled 2021-07-23: qty 30, 30d supply, fill #0
  Filled 2021-08-21: qty 30, 30d supply, fill #1
  Filled 2021-09-23: qty 30, 30d supply, fill #2
  Filled 2021-10-15: qty 30, 30d supply, fill #3

## 2021-07-25 DIAGNOSIS — M1711 Unilateral primary osteoarthritis, right knee: Secondary | ICD-10-CM | POA: Diagnosis not present

## 2021-08-01 DIAGNOSIS — M1712 Unilateral primary osteoarthritis, left knee: Secondary | ICD-10-CM | POA: Diagnosis not present

## 2021-08-02 IMAGING — CR DG ANKLE COMPLETE 3+V*R*
3 series · 3 of 3 positions shown · non-contrast
Comparison: Right tibia and fibula x-rays dated October 09, 2016.

CLINICAL DATA: Fall.

EXAM:
RIGHT ANKLE - COMPLETE 3+ VIEW

[x ankle ap right]
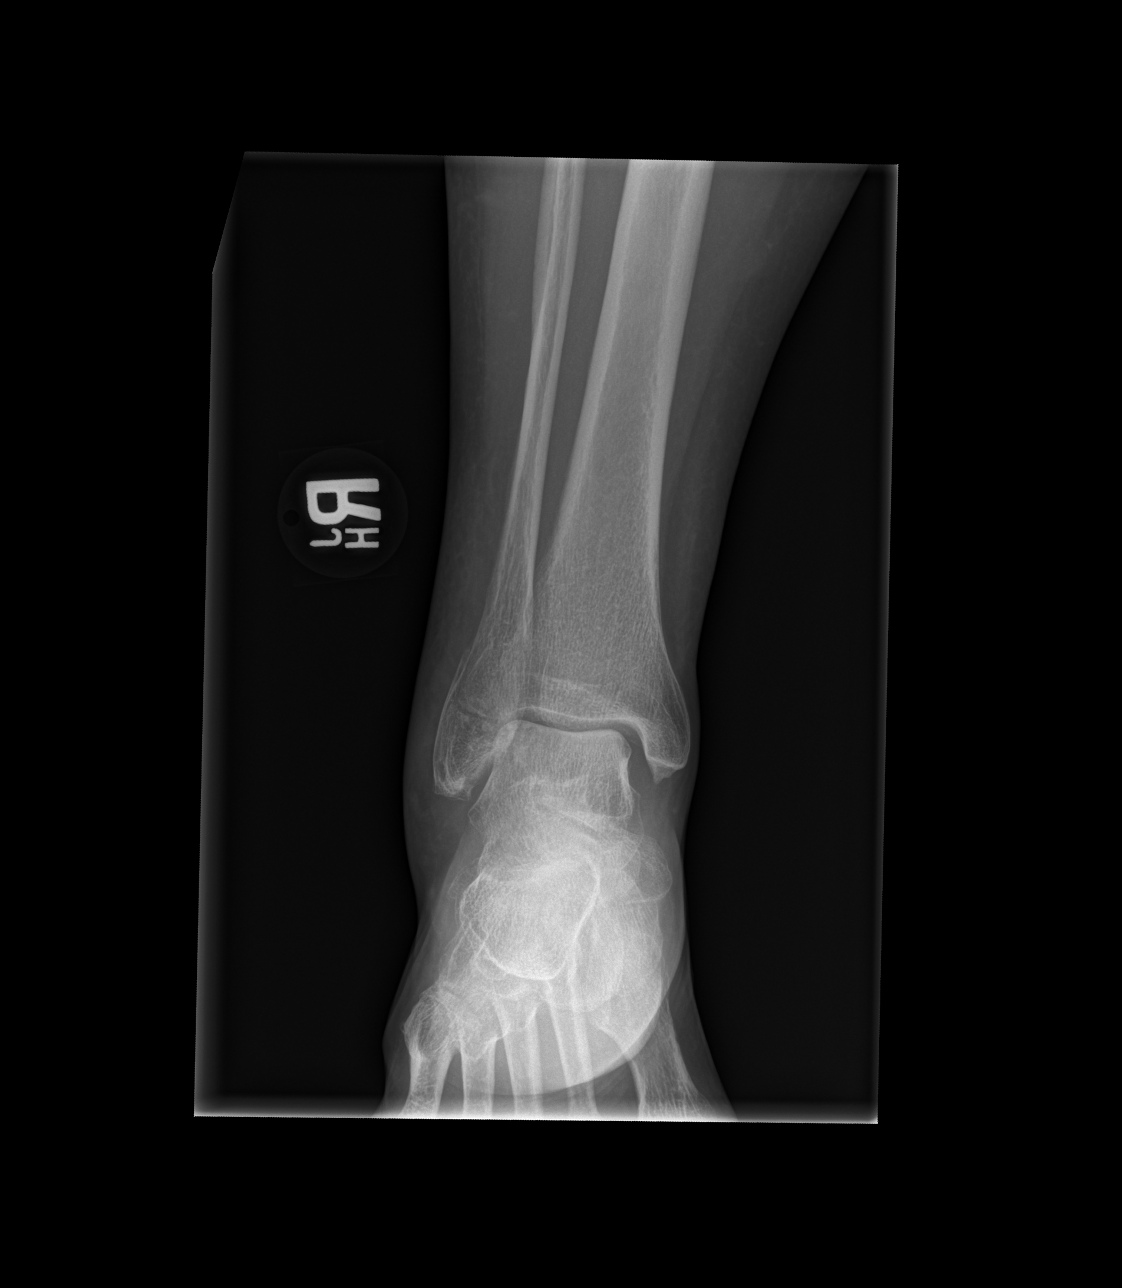

[x ankle obl right]
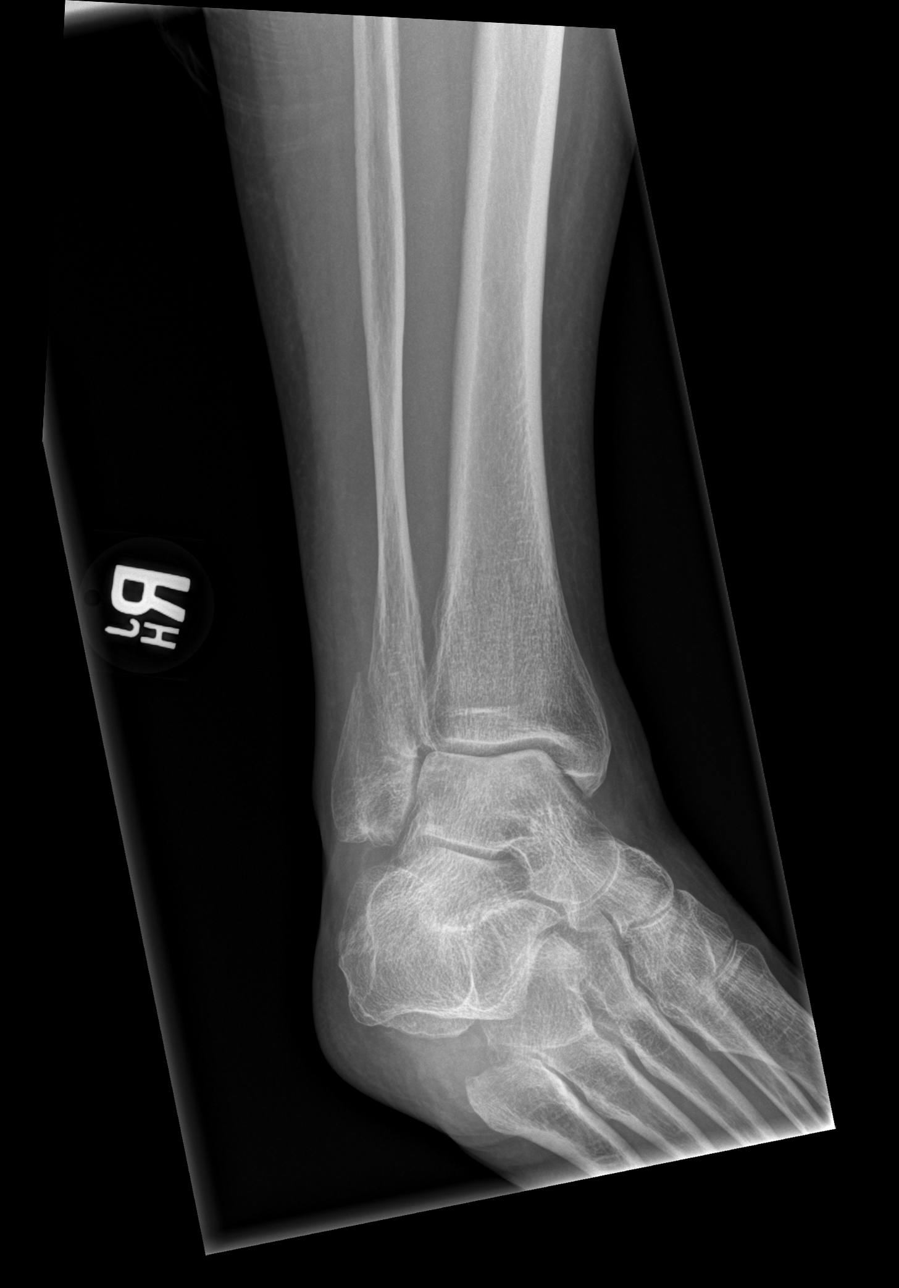

[x ankle lat right]
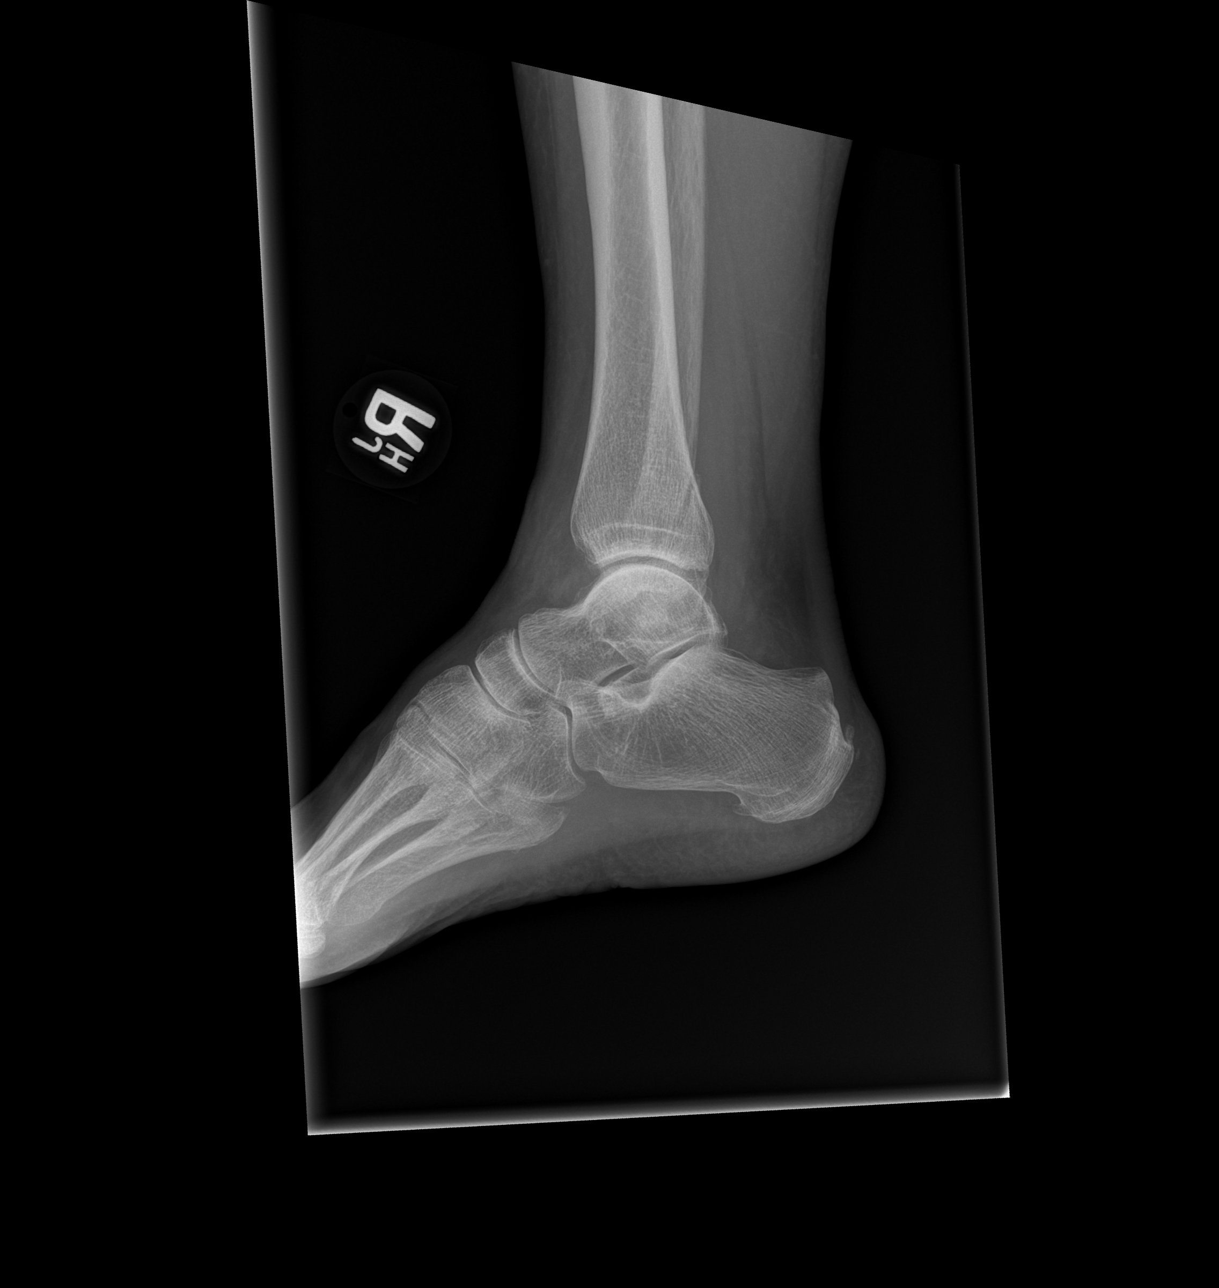

[3 of 3 positions shown; findings below may reference images not displayed]

FINDINGS: Acute minimally displaced oblique fracture of the distal fibula with
intra-articular extension. No additional fracture. No dislocation.
The ankle mortise is symmetric. The talar dome is intact. Small
tibiotalar joint effusion. Joint spaces are preserved. Lateral ankle
soft tissue swelling.
IMPRESSION: 1. Acute minimally displaced intra-articular fracture of the lateral
malleolus with overlying soft tissue swelling.

## 2021-08-09 ENCOUNTER — Other Ambulatory Visit (HOSPITAL_COMMUNITY): Payer: Self-pay

## 2021-08-14 DIAGNOSIS — R319 Hematuria, unspecified: Secondary | ICD-10-CM | POA: Diagnosis not present

## 2021-08-21 ENCOUNTER — Other Ambulatory Visit (HOSPITAL_COMMUNITY): Payer: Self-pay

## 2021-08-27 ENCOUNTER — Other Ambulatory Visit (HOSPITAL_COMMUNITY): Payer: Self-pay

## 2021-09-11 DIAGNOSIS — H35371 Puckering of macula, right eye: Secondary | ICD-10-CM | POA: Diagnosis not present

## 2021-09-11 DIAGNOSIS — H26493 Other secondary cataract, bilateral: Secondary | ICD-10-CM | POA: Diagnosis not present

## 2021-09-11 DIAGNOSIS — H5203 Hypermetropia, bilateral: Secondary | ICD-10-CM | POA: Diagnosis not present

## 2021-09-23 ENCOUNTER — Other Ambulatory Visit (HOSPITAL_COMMUNITY): Payer: Self-pay

## 2021-10-15 ENCOUNTER — Other Ambulatory Visit (HOSPITAL_COMMUNITY): Payer: Self-pay

## 2021-10-16 ENCOUNTER — Other Ambulatory Visit (HOSPITAL_COMMUNITY): Payer: Self-pay

## 2021-10-24 DIAGNOSIS — R252 Cramp and spasm: Secondary | ICD-10-CM | POA: Diagnosis not present

## 2021-10-24 DIAGNOSIS — T148XXA Other injury of unspecified body region, initial encounter: Secondary | ICD-10-CM | POA: Diagnosis not present

## 2021-10-24 DIAGNOSIS — R82998 Other abnormal findings in urine: Secondary | ICD-10-CM | POA: Diagnosis not present

## 2021-10-24 DIAGNOSIS — R609 Edema, unspecified: Secondary | ICD-10-CM | POA: Diagnosis not present

## 2021-10-25 DIAGNOSIS — R600 Localized edema: Secondary | ICD-10-CM | POA: Diagnosis not present

## 2021-10-25 DIAGNOSIS — R609 Edema, unspecified: Secondary | ICD-10-CM | POA: Diagnosis not present

## 2021-11-13 ENCOUNTER — Other Ambulatory Visit (HOSPITAL_COMMUNITY): Payer: Self-pay

## 2021-11-13 MED ORDER — ROSUVASTATIN CALCIUM 5 MG PO TABS
5.0000 mg | ORAL_TABLET | Freq: Every day | ORAL | 3 refills | Status: DC
  Filled 2021-11-13: qty 90, 90d supply, fill #0
  Filled 2022-02-13: qty 90, 90d supply, fill #1
  Filled 2022-05-23: qty 90, 90d supply, fill #2
  Filled 2022-08-18: qty 90, 90d supply, fill #3

## 2021-11-20 ENCOUNTER — Other Ambulatory Visit (HOSPITAL_COMMUNITY): Payer: Self-pay

## 2021-11-20 MED ORDER — LEVOCETIRIZINE DIHYDROCHLORIDE 5 MG PO TABS
5.0000 mg | ORAL_TABLET | Freq: Every evening | ORAL | 3 refills | Status: DC
  Filled 2021-11-20: qty 30, 30d supply, fill #0
  Filled 2021-12-17: qty 30, 30d supply, fill #1
  Filled 2022-01-13: qty 30, 30d supply, fill #2
  Filled 2022-02-13: qty 30, 30d supply, fill #3

## 2021-12-06 ENCOUNTER — Other Ambulatory Visit (HOSPITAL_COMMUNITY): Payer: Self-pay

## 2021-12-06 MED ORDER — PEG 3350-KCL-NA BICARB-NACL 420 G PO SOLR
ORAL | 0 refills | Status: DC
  Filled 2021-12-06: qty 4000, 1d supply, fill #0

## 2021-12-17 ENCOUNTER — Other Ambulatory Visit (HOSPITAL_COMMUNITY): Payer: Self-pay

## 2021-12-19 DIAGNOSIS — Z1211 Encounter for screening for malignant neoplasm of colon: Secondary | ICD-10-CM | POA: Diagnosis not present

## 2021-12-19 DIAGNOSIS — K5730 Diverticulosis of large intestine without perforation or abscess without bleeding: Secondary | ICD-10-CM | POA: Diagnosis not present

## 2022-01-13 ENCOUNTER — Other Ambulatory Visit (HOSPITAL_COMMUNITY): Payer: Self-pay

## 2022-01-15 ENCOUNTER — Other Ambulatory Visit (HOSPITAL_BASED_OUTPATIENT_CLINIC_OR_DEPARTMENT_OTHER): Payer: Self-pay

## 2022-01-15 MED ORDER — COMIRNATY 30 MCG/0.3ML IM SUSY
PREFILLED_SYRINGE | INTRAMUSCULAR | 0 refills | Status: AC
Start: 2022-01-15 — End: ?
  Filled 2022-01-15: qty 0.3, 1d supply, fill #0

## 2022-01-15 MED ORDER — INFLUENZA VAC A&B SA ADJ QUAD 0.5 ML IM PRSY
PREFILLED_SYRINGE | INTRAMUSCULAR | 0 refills | Status: AC
  Filled 2022-01-15: qty 0.5, 1d supply, fill #0

## 2022-02-13 ENCOUNTER — Other Ambulatory Visit (HOSPITAL_COMMUNITY): Payer: Self-pay

## 2022-02-14 ENCOUNTER — Other Ambulatory Visit (HOSPITAL_COMMUNITY): Payer: Self-pay

## 2022-02-14 MED ORDER — VALSARTAN-HYDROCHLOROTHIAZIDE 80-12.5 MG PO TABS
1.0000 | ORAL_TABLET | Freq: Every day | ORAL | 3 refills | Status: DC
  Filled 2022-02-14: qty 90, 90d supply, fill #0
  Filled 2022-08-18: qty 90, 90d supply, fill #1
  Filled 2023-01-15: qty 90, 90d supply, fill #2

## 2022-03-10 ENCOUNTER — Other Ambulatory Visit (HOSPITAL_COMMUNITY): Payer: Self-pay

## 2022-03-11 ENCOUNTER — Other Ambulatory Visit (HOSPITAL_COMMUNITY): Payer: Self-pay

## 2022-03-11 MED ORDER — LEVOCETIRIZINE DIHYDROCHLORIDE 5 MG PO TABS
5.0000 mg | ORAL_TABLET | Freq: Every evening | ORAL | 3 refills | Status: DC
  Filled 2022-03-11: qty 30, 30d supply, fill #0
  Filled 2022-04-21: qty 30, 30d supply, fill #1
  Filled 2022-05-23: qty 30, 30d supply, fill #2
  Filled 2022-06-24: qty 30, 30d supply, fill #3

## 2022-04-02 ENCOUNTER — Other Ambulatory Visit (HOSPITAL_COMMUNITY): Payer: Self-pay

## 2022-04-02 DIAGNOSIS — J3089 Other allergic rhinitis: Secondary | ICD-10-CM | POA: Diagnosis not present

## 2022-04-02 DIAGNOSIS — J301 Allergic rhinitis due to pollen: Secondary | ICD-10-CM | POA: Diagnosis not present

## 2022-04-02 DIAGNOSIS — T781XXD Other adverse food reactions, not elsewhere classified, subsequent encounter: Secondary | ICD-10-CM | POA: Diagnosis not present

## 2022-04-02 DIAGNOSIS — J4520 Mild intermittent asthma, uncomplicated: Secondary | ICD-10-CM | POA: Diagnosis not present

## 2022-04-02 MED ORDER — AZELASTINE HCL 0.1 % NA SOLN
1.0000 | Freq: Two times a day (BID) | NASAL | 5 refills | Status: DC | PRN
  Filled 2022-04-02: qty 30, 25d supply, fill #0
  Filled 2022-12-22: qty 30, 25d supply, fill #1

## 2022-04-02 MED ORDER — LEVOCETIRIZINE DIHYDROCHLORIDE 5 MG PO TABS
5.0000 mg | ORAL_TABLET | Freq: Every day | ORAL | 5 refills | Status: DC
  Filled 2022-04-02 – 2023-01-21 (×2): qty 30, 30d supply, fill #0

## 2022-04-02 MED ORDER — ALBUTEROL SULFATE HFA 108 (90 BASE) MCG/ACT IN AERS
2.0000 | INHALATION_SPRAY | RESPIRATORY_TRACT | 0 refills | Status: DC
  Filled 2022-04-02: qty 6.7, 17d supply, fill #0

## 2022-04-22 ENCOUNTER — Other Ambulatory Visit (HOSPITAL_COMMUNITY): Payer: Self-pay

## 2022-05-19 DIAGNOSIS — L245 Irritant contact dermatitis due to other chemical products: Secondary | ICD-10-CM | POA: Diagnosis not present

## 2022-05-19 DIAGNOSIS — L570 Actinic keratosis: Secondary | ICD-10-CM | POA: Diagnosis not present

## 2022-05-19 DIAGNOSIS — D692 Other nonthrombocytopenic purpura: Secondary | ICD-10-CM | POA: Diagnosis not present

## 2022-05-19 DIAGNOSIS — L821 Other seborrheic keratosis: Secondary | ICD-10-CM | POA: Diagnosis not present

## 2022-05-19 DIAGNOSIS — D1801 Hemangioma of skin and subcutaneous tissue: Secondary | ICD-10-CM | POA: Diagnosis not present

## 2022-05-19 DIAGNOSIS — L820 Inflamed seborrheic keratosis: Secondary | ICD-10-CM | POA: Diagnosis not present

## 2022-05-19 DIAGNOSIS — D225 Melanocytic nevi of trunk: Secondary | ICD-10-CM | POA: Diagnosis not present

## 2022-07-18 DIAGNOSIS — M1711 Unilateral primary osteoarthritis, right knee: Secondary | ICD-10-CM | POA: Diagnosis not present

## 2022-07-23 ENCOUNTER — Other Ambulatory Visit (HOSPITAL_COMMUNITY): Payer: Self-pay

## 2022-07-23 MED ORDER — LEVOCETIRIZINE DIHYDROCHLORIDE 5 MG PO TABS
5.0000 mg | ORAL_TABLET | Freq: Every evening | ORAL | 3 refills | Status: DC
  Filled 2022-07-23: qty 30, 30d supply, fill #0
  Filled 2022-08-18: qty 30, 30d supply, fill #1
  Filled 2022-09-17: qty 30, 30d supply, fill #2
  Filled 2022-10-20: qty 30, 30d supply, fill #3

## 2022-07-25 DIAGNOSIS — M1712 Unilateral primary osteoarthritis, left knee: Secondary | ICD-10-CM | POA: Diagnosis not present

## 2022-07-29 DIAGNOSIS — Z1231 Encounter for screening mammogram for malignant neoplasm of breast: Secondary | ICD-10-CM | POA: Diagnosis not present

## 2022-08-13 DIAGNOSIS — K580 Irritable bowel syndrome with diarrhea: Secondary | ICD-10-CM | POA: Diagnosis not present

## 2022-08-13 DIAGNOSIS — I10 Essential (primary) hypertension: Secondary | ICD-10-CM | POA: Diagnosis not present

## 2022-08-13 DIAGNOSIS — J45909 Unspecified asthma, uncomplicated: Secondary | ICD-10-CM | POA: Diagnosis not present

## 2022-08-13 DIAGNOSIS — G43909 Migraine, unspecified, not intractable, without status migrainosus: Secondary | ICD-10-CM | POA: Diagnosis not present

## 2022-08-13 DIAGNOSIS — D692 Other nonthrombocytopenic purpura: Secondary | ICD-10-CM | POA: Diagnosis not present

## 2022-08-13 DIAGNOSIS — E785 Hyperlipidemia, unspecified: Secondary | ICD-10-CM | POA: Diagnosis not present

## 2022-08-13 DIAGNOSIS — M179 Osteoarthritis of knee, unspecified: Secondary | ICD-10-CM | POA: Diagnosis not present

## 2022-08-13 DIAGNOSIS — Z0000 Encounter for general adult medical examination without abnormal findings: Secondary | ICD-10-CM | POA: Diagnosis not present

## 2022-08-13 DIAGNOSIS — Z23 Encounter for immunization: Secondary | ICD-10-CM | POA: Diagnosis not present

## 2022-08-13 DIAGNOSIS — Z79899 Other long term (current) drug therapy: Secondary | ICD-10-CM | POA: Diagnosis not present

## 2022-08-13 DIAGNOSIS — E559 Vitamin D deficiency, unspecified: Secondary | ICD-10-CM | POA: Diagnosis not present

## 2022-10-27 DIAGNOSIS — H26492 Other secondary cataract, left eye: Secondary | ICD-10-CM | POA: Diagnosis not present

## 2022-10-27 DIAGNOSIS — H52203 Unspecified astigmatism, bilateral: Secondary | ICD-10-CM | POA: Diagnosis not present

## 2022-10-27 DIAGNOSIS — Z961 Presence of intraocular lens: Secondary | ICD-10-CM | POA: Diagnosis not present

## 2022-11-18 ENCOUNTER — Other Ambulatory Visit (HOSPITAL_COMMUNITY): Payer: Self-pay

## 2022-11-18 MED ORDER — ROSUVASTATIN CALCIUM 5 MG PO TABS
5.0000 mg | ORAL_TABLET | Freq: Every day | ORAL | 4 refills | Status: DC
  Filled 2022-11-18: qty 90, 90d supply, fill #0
  Filled 2023-02-23: qty 90, 90d supply, fill #1
  Filled 2023-05-13: qty 90, 90d supply, fill #2
  Filled 2023-08-20: qty 90, 90d supply, fill #3
  Filled 2023-11-16: qty 90, 90d supply, fill #4

## 2022-11-18 MED ORDER — LEVOCETIRIZINE DIHYDROCHLORIDE 5 MG PO TABS
5.0000 mg | ORAL_TABLET | Freq: Every evening | ORAL | 3 refills | Status: DC
  Filled 2022-11-18: qty 30, 30d supply, fill #0
  Filled 2022-12-22: qty 30, 30d supply, fill #1
  Filled 2023-01-15: qty 30, 30d supply, fill #2
  Filled 2023-02-23: qty 30, 30d supply, fill #3

## 2022-11-18 MED ORDER — ALBUTEROL SULFATE HFA 108 (90 BASE) MCG/ACT IN AERS
2.0000 | INHALATION_SPRAY | RESPIRATORY_TRACT | 0 refills | Status: DC | PRN
  Filled 2022-11-18: qty 6.7, 25d supply, fill #0

## 2022-11-19 ENCOUNTER — Other Ambulatory Visit (HOSPITAL_COMMUNITY): Payer: Self-pay

## 2022-12-04 ENCOUNTER — Encounter (HOSPITAL_BASED_OUTPATIENT_CLINIC_OR_DEPARTMENT_OTHER): Payer: Self-pay | Admitting: Obstetrics & Gynecology

## 2022-12-04 ENCOUNTER — Ambulatory Visit (INDEPENDENT_AMBULATORY_CARE_PROVIDER_SITE_OTHER): Payer: Medicare Other | Admitting: Obstetrics & Gynecology

## 2022-12-04 VITALS — BP 130/65 | HR 70 | Ht 63.0 in | Wt 136.8 lb

## 2022-12-04 DIAGNOSIS — Z800 Family history of malignant neoplasm of digestive organs: Secondary | ICD-10-CM

## 2022-12-04 DIAGNOSIS — Z780 Asymptomatic menopausal state: Secondary | ICD-10-CM

## 2022-12-04 DIAGNOSIS — Z01419 Encounter for gynecological examination (general) (routine) without abnormal findings: Secondary | ICD-10-CM

## 2022-12-04 DIAGNOSIS — M8580 Other specified disorders of bone density and structure, unspecified site: Secondary | ICD-10-CM | POA: Diagnosis not present

## 2022-12-04 NOTE — Progress Notes (Signed)
67 y.o. G0P0000 Married White or Caucasian female here for breast and pelvic exam.  Denies vaginal bleeding.  Does have some issues with rectal bleeding due to hemorrhoids.  Pretty sure she did a colonoscopy last year due to family hx.  Do not have this but will have pt sign a release.    Patient's last menstrual period was 03/31/1994.          Sexually active: No H/O STD:  no  Health Maintenance: PCP:  Dr. Kevan Ny.  Last wellness appt was 2023.  Did blood work at that appt:  yes Vaccines are up to date:  pt aware I do not have an updated tdap Colonoscopy:  release will be signed.  Pt feels was within the last year MMG:  pt reports she did have one done this past year BMD:  pt feels certain that she's done this in the last few years.  Last one I have was from 2019.   Last pap smear:  not indicated due to hysterectomy   reports that she has never smoked. She has never used smokeless tobacco. She reports that she does not currently use alcohol. She reports that she does not use drugs.  Past Medical History:  Diagnosis Date   Ankle fracture, left 04/06/11   Arthritis    in neck   Asthma 1993   History of migraine headaches    IBS (irritable bowel syndrome)    Meniscus tear    patella spurs, arthritis-having steroid injections   Migraine without aura, without mention of intractable migraine without mention of status migrainosus 03/09/2013   Osteopenia    mild   Precancerous lesion 03/2016   Wrist fracture 2007   pisiform     x3    Past Surgical History:  Procedure Laterality Date   CATARACT EXTRACTION Bilateral    DIAGNOSTIC LAPAROSCOPY  10/96   Endometriosis   FRACTURE SURGERY Left 2007   ulner/radius   LIPOSUCTION  2001   NOSE SURGERY  1987   VAGINAL HYSTERECTOMY  11/96   Dysmenorrhea-TVH    Medications:  reviewed with pt   Family History  Problem Relation Age of Onset   Asthma Sister        2 years younger   Diabetes Sister        2 years younger   Hypertension  Father    Rheum arthritis Sister        4 years younger   Kidney failure Mother        complications from surgery   Colitis Mother    Crohn's disease Maternal Aunt     Review of Systems  Constitutional: Negative.   Genitourinary: Negative.     Exam:   BP 130/65 (BP Location: Left Arm, Patient Position: Sitting, Cuff Size: Normal)   Pulse 70   Ht 5\' 3"  (1.6 m)   Wt 136 lb 12.8 oz (62.1 kg)   LMP 03/31/1994   BMI 24.23 kg/m   Height: 5\' 3"  (160 cm)  General appearance: alert, cooperative and appears stated age Breasts: normal appearance, no masses or tenderness Abdomen: soft, non-tender; bowel sounds normal; no masses,  no organomegaly Lymph nodes: Cervical, supraclavicular, and axillary nodes normal.  No abnormal inguinal nodes palpated Neurologic: Grossly normal  Pelvic: External genitalia:  no lesions              Urethra:  normal appearing urethra with no masses, tenderness or lesions  Bartholins and Skenes: normal                 Vagina: normal appearing vagina with atrophic changes and no discharge, no lesions              Cervix: absent              Pap taken: No. Bimanual Exam:  Uterus:  uterus absent              Adnexa: no mass, fullness, tenderness               Rectovaginal: Confirms               Anus:  normal sphincter tone, no lesions, hemorrhoids almost circumferentially around the rectum  Chaperone, Ina Homes, CMA, was present for exam.  Assessment/Plan: 1. Encntr for gyn exam (general) (routine) w/o abn findings - Pap smear not indicated - Mammogram done in 2023 per pt.  Will reach out to [facility] to get copy. - Colonoscopy 2023.  Release signed today.  - Bone mineral density will be obtained form [facility] - lab work done with PCP - vaccines reviewed/updated  2. Family history of colon cancer - does colonoscopy every 5 years  3. Postmenopausal - not on HRT  4. Osteopenia, unspecified location

## 2022-12-10 ENCOUNTER — Other Ambulatory Visit (HOSPITAL_COMMUNITY): Payer: Self-pay

## 2022-12-10 MED ORDER — AZITHROMYCIN 500 MG PO TABS
ORAL_TABLET | ORAL | 0 refills | Status: DC
  Filled 2022-12-10: qty 4, 3d supply, fill #0

## 2022-12-19 DIAGNOSIS — M1711 Unilateral primary osteoarthritis, right knee: Secondary | ICD-10-CM | POA: Diagnosis not present

## 2022-12-22 ENCOUNTER — Other Ambulatory Visit (HOSPITAL_COMMUNITY): Payer: Self-pay

## 2022-12-25 DIAGNOSIS — M1712 Unilateral primary osteoarthritis, left knee: Secondary | ICD-10-CM | POA: Diagnosis not present

## 2023-01-05 ENCOUNTER — Other Ambulatory Visit (HOSPITAL_BASED_OUTPATIENT_CLINIC_OR_DEPARTMENT_OTHER): Payer: Self-pay

## 2023-01-05 MED ORDER — COVID-19 MRNA VAC-TRIS(PFIZER) 30 MCG/0.3ML IM SUSY
0.3000 mL | PREFILLED_SYRINGE | Freq: Once | INTRAMUSCULAR | 0 refills | Status: AC
  Filled 2023-01-05: qty 0.3, 1d supply, fill #0

## 2023-01-05 MED ORDER — INFLUENZA VAC A&B SURF ANT ADJ 0.5 ML IM SUSY
0.5000 mL | PREFILLED_SYRINGE | Freq: Once | INTRAMUSCULAR | 0 refills | Status: AC
  Filled 2023-01-05: qty 0.5, 1d supply, fill #0

## 2023-01-21 ENCOUNTER — Other Ambulatory Visit (HOSPITAL_COMMUNITY): Payer: Self-pay

## 2023-01-21 ENCOUNTER — Encounter (HOSPITAL_BASED_OUTPATIENT_CLINIC_OR_DEPARTMENT_OTHER): Payer: Self-pay | Admitting: *Deleted

## 2023-04-13 ENCOUNTER — Other Ambulatory Visit (HOSPITAL_COMMUNITY): Payer: Self-pay

## 2023-04-13 DIAGNOSIS — J3089 Other allergic rhinitis: Secondary | ICD-10-CM | POA: Diagnosis not present

## 2023-04-13 DIAGNOSIS — J4520 Mild intermittent asthma, uncomplicated: Secondary | ICD-10-CM | POA: Diagnosis not present

## 2023-04-13 DIAGNOSIS — J301 Allergic rhinitis due to pollen: Secondary | ICD-10-CM | POA: Diagnosis not present

## 2023-04-13 DIAGNOSIS — J300 Vasomotor rhinitis: Secondary | ICD-10-CM | POA: Diagnosis not present

## 2023-04-13 MED ORDER — FLUTICASONE PROPIONATE HFA 44 MCG/ACT IN AERO
2.0000 | INHALATION_SPRAY | Freq: Two times a day (BID) | RESPIRATORY_TRACT | 3 refills | Status: DC
  Filled 2023-04-13: qty 10.6, 30d supply, fill #0

## 2023-04-13 MED ORDER — LEVOCETIRIZINE DIHYDROCHLORIDE 5 MG PO TABS
5.0000 mg | ORAL_TABLET | Freq: Every evening | ORAL | 5 refills | Status: DC
  Filled 2023-04-13: qty 30, 30d supply, fill #0
  Filled 2023-05-13: qty 30, 30d supply, fill #1
  Filled 2023-06-22: qty 30, 30d supply, fill #2
  Filled 2023-07-22: qty 30, 30d supply, fill #3
  Filled 2023-08-10 – 2023-08-17 (×2): qty 30, 30d supply, fill #4
  Filled 2023-09-16: qty 30, 30d supply, fill #5

## 2023-04-13 MED ORDER — IPRATROPIUM BROMIDE 0.03 % NA SOLN
2.0000 | Freq: Three times a day (TID) | NASAL | 3 refills | Status: AC | PRN
  Filled 2023-04-13: qty 30, 30d supply, fill #0

## 2023-04-13 MED ORDER — ALBUTEROL SULFATE HFA 108 (90 BASE) MCG/ACT IN AERS
2.0000 | INHALATION_SPRAY | RESPIRATORY_TRACT | 0 refills | Status: AC
  Filled 2023-04-13: qty 6.7, 17d supply, fill #0

## 2023-04-14 ENCOUNTER — Other Ambulatory Visit (HOSPITAL_COMMUNITY): Payer: Self-pay

## 2023-05-20 DIAGNOSIS — L821 Other seborrheic keratosis: Secondary | ICD-10-CM | POA: Diagnosis not present

## 2023-05-20 DIAGNOSIS — L570 Actinic keratosis: Secondary | ICD-10-CM | POA: Diagnosis not present

## 2023-08-04 DIAGNOSIS — Z1231 Encounter for screening mammogram for malignant neoplasm of breast: Secondary | ICD-10-CM | POA: Diagnosis not present

## 2023-08-10 ENCOUNTER — Other Ambulatory Visit (HOSPITAL_COMMUNITY): Payer: Self-pay

## 2023-08-10 MED ORDER — AZELASTINE HCL 137 MCG/SPRAY NA SOLN
1.0000 | Freq: Two times a day (BID) | NASAL | 5 refills | Status: AC | PRN
  Filled 2023-08-10: qty 30, 25d supply, fill #0
  Filled 2023-09-16: qty 30, 25d supply, fill #1
  Filled 2023-12-14: qty 30, 25d supply, fill #2
  Filled 2024-02-22: qty 30, 25d supply, fill #3
  Filled 2024-04-21: qty 30, 25d supply, fill #4

## 2023-08-17 ENCOUNTER — Other Ambulatory Visit (HOSPITAL_COMMUNITY): Payer: Self-pay

## 2023-08-20 ENCOUNTER — Other Ambulatory Visit (HOSPITAL_COMMUNITY): Payer: Self-pay

## 2023-08-20 DIAGNOSIS — Z0000 Encounter for general adult medical examination without abnormal findings: Secondary | ICD-10-CM | POA: Diagnosis not present

## 2023-08-20 DIAGNOSIS — D692 Other nonthrombocytopenic purpura: Secondary | ICD-10-CM | POA: Diagnosis not present

## 2023-08-20 DIAGNOSIS — I10 Essential (primary) hypertension: Secondary | ICD-10-CM | POA: Diagnosis not present

## 2023-08-20 DIAGNOSIS — E785 Hyperlipidemia, unspecified: Secondary | ICD-10-CM | POA: Diagnosis not present

## 2023-08-20 DIAGNOSIS — K219 Gastro-esophageal reflux disease without esophagitis: Secondary | ICD-10-CM | POA: Diagnosis not present

## 2023-08-20 DIAGNOSIS — Z79899 Other long term (current) drug therapy: Secondary | ICD-10-CM | POA: Diagnosis not present

## 2023-08-20 DIAGNOSIS — G43909 Migraine, unspecified, not intractable, without status migrainosus: Secondary | ICD-10-CM | POA: Diagnosis not present

## 2023-08-20 DIAGNOSIS — M7918 Myalgia, other site: Secondary | ICD-10-CM | POA: Diagnosis not present

## 2023-08-20 DIAGNOSIS — E559 Vitamin D deficiency, unspecified: Secondary | ICD-10-CM | POA: Diagnosis not present

## 2023-08-20 DIAGNOSIS — K580 Irritable bowel syndrome with diarrhea: Secondary | ICD-10-CM | POA: Diagnosis not present

## 2023-08-20 DIAGNOSIS — H269 Unspecified cataract: Secondary | ICD-10-CM | POA: Diagnosis not present

## 2023-08-20 DIAGNOSIS — H9313 Tinnitus, bilateral: Secondary | ICD-10-CM | POA: Diagnosis not present

## 2023-08-20 MED ORDER — RSVPREF3 VAC RECOMB ADJUVANTED 120 MCG/0.5ML IM SUSR
0.5000 mL | Freq: Once | INTRAMUSCULAR | 0 refills | Status: AC
Start: 2023-08-20 — End: 2023-08-21
  Filled 2023-08-20: qty 0.5, 1d supply, fill #0

## 2023-08-21 ENCOUNTER — Other Ambulatory Visit (HOSPITAL_COMMUNITY): Payer: Self-pay

## 2023-08-21 MED ORDER — VALSARTAN-HYDROCHLOROTHIAZIDE 80-12.5 MG PO TABS
0.5000 | ORAL_TABLET | Freq: Every day | ORAL | 3 refills | Status: AC
  Filled 2023-08-21: qty 45, 90d supply, fill #0
  Filled 2023-11-16: qty 45, 90d supply, fill #1
  Filled 2024-02-12: qty 45, 90d supply, fill #2

## 2023-08-27 ENCOUNTER — Other Ambulatory Visit (HOSPITAL_COMMUNITY): Payer: Self-pay

## 2023-09-01 ENCOUNTER — Other Ambulatory Visit (HOSPITAL_COMMUNITY): Payer: Self-pay

## 2023-10-18 ENCOUNTER — Other Ambulatory Visit (HOSPITAL_COMMUNITY): Payer: Self-pay

## 2023-10-19 ENCOUNTER — Other Ambulatory Visit (HOSPITAL_COMMUNITY): Payer: Self-pay

## 2023-10-19 MED ORDER — LEVOCETIRIZINE DIHYDROCHLORIDE 5 MG PO TABS
5.0000 mg | ORAL_TABLET | Freq: Every evening | ORAL | 5 refills | Status: DC
  Filled 2023-10-19: qty 30, 30d supply, fill #0
  Filled 2023-11-13: qty 30, 30d supply, fill #1
  Filled 2023-12-14: qty 30, 30d supply, fill #2
  Filled 2024-01-12: qty 30, 30d supply, fill #3
  Filled 2024-02-12: qty 30, 30d supply, fill #4
  Filled 2024-03-07: qty 30, 30d supply, fill #5

## 2023-10-28 DIAGNOSIS — H26493 Other secondary cataract, bilateral: Secondary | ICD-10-CM | POA: Diagnosis not present

## 2023-10-28 DIAGNOSIS — H04122 Dry eye syndrome of left lacrimal gland: Secondary | ICD-10-CM | POA: Diagnosis not present

## 2023-10-29 DIAGNOSIS — S76312A Strain of muscle, fascia and tendon of the posterior muscle group at thigh level, left thigh, initial encounter: Secondary | ICD-10-CM | POA: Diagnosis not present

## 2023-12-14 ENCOUNTER — Other Ambulatory Visit (HOSPITAL_COMMUNITY): Payer: Self-pay

## 2024-01-22 ENCOUNTER — Other Ambulatory Visit (HOSPITAL_BASED_OUTPATIENT_CLINIC_OR_DEPARTMENT_OTHER): Payer: Self-pay

## 2024-01-22 MED ORDER — COMIRNATY 30 MCG/0.3ML IM SUSY
0.3000 mL | PREFILLED_SYRINGE | Freq: Once | INTRAMUSCULAR | 0 refills | Status: AC
  Filled 2024-01-22: qty 0.3, 1d supply, fill #0

## 2024-01-22 MED ORDER — FLUZONE HIGH-DOSE 0.5 ML IM SUSY
0.5000 mL | PREFILLED_SYRINGE | Freq: Once | INTRAMUSCULAR | 0 refills | Status: AC
  Filled 2024-01-22: qty 0.5, 1d supply, fill #0

## 2024-02-12 ENCOUNTER — Other Ambulatory Visit (HOSPITAL_COMMUNITY): Payer: Self-pay

## 2024-02-12 ENCOUNTER — Encounter (HOSPITAL_COMMUNITY): Payer: Self-pay

## 2024-02-12 ENCOUNTER — Emergency Department (HOSPITAL_COMMUNITY)

## 2024-02-12 ENCOUNTER — Emergency Department (HOSPITAL_COMMUNITY)
Admission: EM | Admit: 2024-02-12 | Discharge: 2024-02-12 | Disposition: A | Discharge status: Home / Self Care | Attending: Emergency Medicine | Admitting: Emergency Medicine

## 2024-02-12 ENCOUNTER — Other Ambulatory Visit: Payer: Self-pay

## 2024-02-12 DIAGNOSIS — J45909 Unspecified asthma, uncomplicated: Secondary | ICD-10-CM | POA: Insufficient documentation

## 2024-02-12 DIAGNOSIS — Z7951 Long term (current) use of inhaled steroids: Secondary | ICD-10-CM | POA: Insufficient documentation

## 2024-02-12 DIAGNOSIS — W1830XA Fall on same level, unspecified, initial encounter: Secondary | ICD-10-CM | POA: Insufficient documentation

## 2024-02-12 DIAGNOSIS — M25551 Pain in right hip: Secondary | ICD-10-CM | POA: Diagnosis present

## 2024-02-12 DIAGNOSIS — R55 Syncope and collapse: Secondary | ICD-10-CM | POA: Insufficient documentation

## 2024-02-12 DIAGNOSIS — Z79899 Other long term (current) drug therapy: Secondary | ICD-10-CM | POA: Insufficient documentation

## 2024-02-12 DIAGNOSIS — S7001XA Contusion of right hip, initial encounter: Secondary | ICD-10-CM | POA: Diagnosis not present

## 2024-02-12 DIAGNOSIS — I10 Essential (primary) hypertension: Secondary | ICD-10-CM | POA: Diagnosis not present

## 2024-02-12 DIAGNOSIS — E860 Dehydration: Secondary | ICD-10-CM

## 2024-02-12 LAB — COMPREHENSIVE METABOLIC PANEL WITH GFR
ALT: 11 U/L (ref 0–44)
AST: 19 U/L (ref 15–41)
Albumin: 4 g/dL (ref 3.5–5.0)
Alkaline Phosphatase: 47 U/L (ref 38–126)
Anion gap: 11 (ref 5–15)
BUN: 16 mg/dL (ref 8–23)
CO2: 24 mmol/L (ref 22–32)
Calcium: 9.4 mg/dL (ref 8.9–10.3)
Chloride: 104 mmol/L (ref 98–111)
Creatinine, Ser: 0.88 mg/dL (ref 0.44–1.00)
GFR, Estimated: >60 mL/min (ref >60)
Glucose, Bld: 91 mg/dL (ref 70–99)
Potassium: 3.5 mmol/L (ref 3.5–5.1)
Sodium: 139 mmol/L (ref 135–145)
Total Bilirubin: 2.3 mg/dL — ABNORMAL HIGH (ref 0.0–1.2)
Total Protein: 7.3 g/dL (ref 6.5–8.1)

## 2024-02-12 LAB — CBC
HCT: 38.3 % (ref 36.0–46.0)
Hemoglobin: 12.9 g/dL (ref 12.0–15.0)
MCH: 30.5 pg (ref 26.0–34.0)
MCHC: 33.7 g/dL (ref 30.0–36.0)
MCV: 90.5 fL (ref 80.0–100.0)
Platelets: 274 K/uL (ref 150–400)
RBC: 4.23 MIL/uL (ref 3.87–5.11)
RDW: 12.7 % (ref 11.5–15.5)
WBC: 8.1 K/uL (ref 4.0–10.5)
nRBC: 0 % (ref 0.0–0.2)

## 2024-02-12 LAB — URINALYSIS, ROUTINE W REFLEX MICROSCOPIC
Bacteria, UA: NONE SEEN
Bilirubin Urine: NEGATIVE
Glucose, UA: NEGATIVE mg/dL
Ketones, ur: NEGATIVE mg/dL
Leukocytes,Ua: NEGATIVE
Nitrite: NEGATIVE
Protein, ur: NEGATIVE mg/dL
Specific Gravity, Urine: 1.009 (ref 1.005–1.030)
pH: 6 (ref 5.0–8.0)

## 2024-02-12 LAB — CBG MONITORING, ED: Glucose-Capillary: 86 mg/dL (ref 70–99)

## 2024-02-12 LAB — TROPONIN I (HIGH SENSITIVITY): Troponin I (High Sensitivity): 3 ng/L (ref <18)

## 2024-02-12 MED ORDER — ROSUVASTATIN CALCIUM 5 MG PO TABS
5.0000 mg | ORAL_TABLET | Freq: Every day | ORAL | 3 refills | Status: AC
  Filled 2024-02-12: qty 90, 90d supply, fill #0

## 2024-02-12 MED ORDER — SODIUM CHLORIDE 0.9 % IV BOLUS
1000.0000 mL | Freq: Once | INTRAVENOUS | Status: AC
  Administered 2024-02-12: 1000 mL via INTRAVENOUS

## 2024-02-12 NOTE — ED Provider Notes (Signed)
 [place] EMERGENCY DEPARTMENT AT [facility] Provider Note   CSN: [id number] Arrival date & time: 02/12/24  1155     Patient presents with: No chief complaint on file.   Susan Crosby is a 68 y.o. female.   Past medical history including asthma, hypertension and allergic rhinitis presenting after a syncopal episode witnessed by her husband on Wednesday.  They report she was bending over working on a project when she stood up abruptly.  At that time she was able to call out to her husband and stated that she felt like she was going to pass out.  She describes her peripheral vision being black but being able to clearly see the objects directly in front of her.  Her husband reports that they were able to lower her in a quick but controlled fashion to the floor.  She did land on her right side but did not hit her head.  Since then she has felt off, somewhat weak.  She reports that today she has not had anything to eat or drink and she has felt afraid to walk to the bathroom.  Denies chest pain, fever, chills, other sick symptoms.  Does have a stuffy nose but reports this is chronic as she has allergic rhinitis.        Prior to Admission medications   Medication Sig Start Date End Date Taking? Authorizing Provider  albuterol  (VENTOLIN  HFA) 108 (90 Base) MCG/ACT inhaler Inhale 1-2 puffs into the lungs. 15 min prior to exercise of every 4 hours as needed.  Use with spacer.    [provider]  albuterol  (VENTOLIN  HFA) 108 (90 Base) MCG/ACT inhaler Inhale 2 puffs into the lungs as needed every 4 (four) hours for cough/wheeze 04/02/22     albuterol  (VENTOLIN  HFA) 108 (90 Base) MCG/ACT inhaler Inhale 2 puffs into the lungs every 4 - 6 hours as needed for cough/wheeze. 11/18/22     albuterol  (VENTOLIN  HFA) 108 (90 Base) MCG/ACT inhaler Inhale 2 puffs into the lungs every 4 (four) hours for cough/wheezing. 04/13/23     azelastine  (ASTELIN ) 0.1 % nasal spray Place 1 spray into  both nostrils 2 (two) times daily. Use in each nostril as directed    [provider]  azelastine  (ASTELIN ) 0.1 % nasal spray INSTILL 1-2 SPRAYS IN EACH NOSTRIL TWICE A DAY AS NEEDED 07/05/19 07/04/20  Fleeta Smock, Lamar BROCKS, MD  azelastine  (ASTELIN ) 0.1 % nasal spray Place 1-2 sprays into both nostrils 2 (two) times daily as needed. 01/29/21     Azelastine  HCl 137 MCG/SPRAY SOLN Place 1-2 sprays into both nostrils 2 (two) times daily as needed. 08/10/23     azithromycin  (ZITHROMAX ) 500 MG tablet For non-bloody diarrhea, take 2 tablets on day 1. If resolved, stop taking. If diarrhea persists, take 1 tablet on days 2 and 3. For bloody diarrhea, take 2 tablets day 1 and 1 tablet on days 2 and 3. 12/10/22   Melvenia Ip, NP  Calcium  Carbonate (CALCIUM  600 PO) Take by mouth.    [provider]  CELEBREX  200 MG capsule TAKE 1 CAPSULE BY MOUTH 2 TIMES DAILY AS NEEDED. 03/11/17   Jenel Carlin POUR, MD  Cholecalciferol (VITAMIN D3 PO) Take 1,600 mg by mouth daily.    [provider]  COVID-19 mRNA bivalent vaccine, Pfizer, (PFIZER COVID-19 VAC BIVALENT) injection Inject into the muscle. 01/21/21   Luiz Channel, MD  COVID-19 mRNA Vac-TriS, Pfizer, (PFIZER-BIONT COVID-19 VAC-TRIS) SUSP injection Inject into the muscle. 08/09/20  Luiz Channel, MD  COVID-19 mRNA vaccine [phone] (COMIRNATY ) syringe Inject into the muscle. 01/15/22     fluorouracil (EFUDEX) 5 % cream Apply 1 Dose topically as needed.  04/02/16   [provider]  fluticasone  (FLOVENT  HFA) 44 MCG/ACT inhaler Inhale 2 puffs into the lungs 2 (two) times daily. 04/13/23     influenza vaccine adjuvanted (FLUAD ) 0.5 ML injection Inject into the muscle. 02/04/21     influenza vaccine adjuvanted (FLUAD ) 0.5 ML injection Inject into the muscle. 01/15/22     ipratropium (ATROVENT ) 0.03 % nasal spray Place 2 sprays into both nostrils 3 (three) times daily as needed for watery nasal discharge 30 days 04/13/23     levocetirizine  (XYZAL ) 5 MG tablet Take 5 mg by mouth every evening.    [provider]  levocetirizine (XYZAL ) 5 MG tablet TAKE 1 TABLET BY MOUTH EVERY EVENING 04/19/20 04/19/21  Fleeta Smock, Lamar BROCKS, MD  levocetirizine (XYZAL ) 5 MG tablet TAKE 1 TABLET BY MOUTH EVERY EVENING 09/20/19 09/19/20  Fleeta Smock, Lamar BROCKS, MD  levocetirizine (XYZAL ) 5 MG tablet TAKE 1 TABLET BY MOUTH DAILY IN THE EVENING 07/05/19 07/04/20  Fleeta Smock Lamar BROCKS, MD  levocetirizine (XYZAL ) 5 MG tablet Take 1 tablet (5 mg total) by mouth daily in the evening. 04/02/22     levocetirizine (XYZAL ) 5 MG tablet Take 1 tablet (5 mg total) by mouth every evening. 10/18/23     mupirocin ointment (BACTROBAN) 2 % Place 1 application  into the nose as needed.    [provider]  polyethylene glycol-electrolytes (NULYTELY) 420 g solution Use as directed 08/21/21   Dianna Specking, MD  rosuvastatin  (CRESTOR ) 5 MG tablet Take 1 tablet (5 mg total) by mouth daily. 02/12/24     saccharomyces boulardii (FLORASTOR) 250 MG capsule Take 250 mg by mouth daily.     [provider]  Spacer/Aero-Holding Chambers (AEROCHAMBER MV) inhaler Use as instructed    [provider]  SUMAtriptan  (IMITREX ) 100 MG tablet Take 0.5 tablets (50 mg total) by mouth 2 (two) times daily as needed for migraine or headache. May repeat in 2 hours if headache persists or recurs. Pt take may take 1/2 of pill 03/17/18   Jenel Carlin POUR, MD  valsartan -hydrochlorothiazide  (DIOVAN -HCT) 80-12.5 MG tablet Take 1 tablet by mouth daily. 02/13/22     valsartan -hydrochlorothiazide  (DIOVAN -HCT) 80-12.5 MG tablet Take 1/2 tablet by mouth daily. 08/21/23       Allergies: Cefpodoxime , Cheratussin ac [guaifenesin-codeine], Fiorinal [butalbital-aspirin-caffeine], and Other    Review of Systems  Updated Vital Signs BP (!) 125/52   Pulse 78   Temp 98.6 F (37 C) (Oral)   Resp 18   Ht 5' 4 (1.626 m)   Wt 54.4 kg   LMP 03/31/1994   SpO2 100%   BMI 20.60 kg/m    Physical Exam Constitutional:      Appearance: Normal appearance.  HENT:     Head: Normocephalic and atraumatic.     Nose: Nose normal.     Mouth/Throat:     Mouth: Mucous membranes are dry.     Pharynx: Oropharynx is clear.  Eyes:     Extraocular Movements: Extraocular movements intact.     Pupils: Pupils are equal, round, and reactive to light.  Cardiovascular:     Rate and Rhythm: Normal rate and regular rhythm.     Pulses: Normal pulses.  Pulmonary:     Effort: Pulmonary effort is normal.     Breath sounds: Normal breath  sounds.  Abdominal:     General: Abdomen is flat. Bowel sounds are normal.     Palpations: Abdomen is soft.  Musculoskeletal:        General: Tenderness present. No swelling or deformity. Normal range of motion.     Cervical back: Normal range of motion and neck supple.  Skin:    General: Skin is warm and dry.     Capillary Refill: Capillary refill takes more than 3 seconds.     Findings: Bruising present.  Neurological:     General: No focal deficit present.     Mental Status: She is alert and oriented to person, place, and time.     Cranial Nerves: No cranial nerve deficit.     Sensory: No sensory deficit.     (all labs ordered are listed, but only abnormal results are displayed) Labs Reviewed  COMPREHENSIVE METABOLIC PANEL WITH GFR - Abnormal; Notable for the following components:      Result Value   Total Bilirubin 2.3 (*)    All other components within normal limits  URINALYSIS, ROUTINE W REFLEX MICROSCOPIC - Abnormal; Notable for the following components:   Color, Urine STRAW (*)    Hgb urine dipstick SMALL (*)    All other components within normal limits  CBC  CBG MONITORING, ED  TROPONIN I (HIGH SENSITIVITY)    EKG: EKG Interpretation Date/Time:  Friday February 12 2024 12:08:57 EST Ventricular Rate:  90 PR Interval:  120 QRS Duration:  88 QT Interval:  374 QTC Calculation: 457 R Axis:   37  Text Interpretation: Normal  sinus rhythm Nonspecific ST abnormality Abnormal ECG Interpretation limited secondary to artifact When compared with ECG of 21-Mar-2004 12:24, PREVIOUS ECG IS PRESENT Confirmed by Elnor Savant ([id number]) on 02/12/2024 6:27:50 PM  Radiology: No results found.   Procedures   Medications Ordered in the ED  sodium chloride  0.9 % bolus 1,000 mL (has no administration in time range)    Clinical Course as of 02/12/24 2124  Fri Feb 12, 2024  2037 [facility] Syncope LOW risk [SG]    Clinical Course User Index [SG] Elnor Savant LABOR, DO                                 Medical Decision Making This is a 68 year old female patient with no prior cardiac or neurological problems presenting after 1 episode of syncope at home which was witnessed.  Total loss of consciousness was less than 30 seconds without head injury.  EKG in the ED and at outpatient walk-in clinic was normal.  CBC and CMP were collected in triage and have also come back unremarkable.  UA also unremarkable.  CBG 86.  Given low diastolic pressure, elevated capillary refill suspect element of dehydration.  Will give 1 L normal saline bolus and reassess.  Also obtaining troponin to rule out further cardiac involvement.  X-rays obtained of right humerus and hip given bruising present on those extremities as well as chest to complete the cardiac workup.  Overall patient is hemodynamically stable and this is most consistent with a syncopal episode.  Blood pressure and capillary refill improved after 1 L fluid bolus.  Orthostatics negative.  Patient able to ambulate appropriately with minimal assistance.  Discussed appropriate fluid hydration at home and return precautions.  Stable for discharge  Amount and/or Complexity of Data Reviewed Radiology: ordered.    Final diagnoses:  None  ED Discharge Orders     None          Cleotilde Lukes, DO 02/12/24 2127    Elnor Jayson LABOR, DO 02/19/24 [phone]

## 2024-02-12 NOTE — Discharge Instructions (Addendum)
 Make sure you drink plenty of fluids over the next 24 hours.  You can drink water, Gatorade, ginger ale, broth etc. what ever you would prefer and that sits well with your stomach.  Try to get up slowly from sitting to standing and from lying down to sitting over the next 24 hours to avoid causing further dizziness or passing out.

## 2024-02-12 NOTE — ED Triage Notes (Signed)
 Denies headaches, numbness, blurred vision. Axox4.

## 2024-02-12 NOTE — ED Triage Notes (Signed)
 Pt had a witnessed syncopal episode on Wednesday. Pt family member reports she had LOC while standing and fell to the ground, did not hit her head as family protected her head on the way down. Pt c/o pain in her right hip. States ever since she has felt dizzy and weak.

## 2024-02-12 NOTE — ED Provider Triage Note (Signed)
 Emergency Medicine Provider Triage Evaluation Note  Susan Crosby , a 68 y.o. female  was evaluated in triage.  Pt complains of dizzy. Had a witnessed near syncope episode 2 days ago while standing.  Since then she felt lightheadedness/dizzy, tingling sensation throughout body and overall not feeling well.  No headache, cp, sob, abd pain, back pain, dysuria, focal numbness, focal weakness.  No recent medication changes  Review of Systems  Positive: As above Negative: As above  Physical Exam  BP (!) 147/80 (BP Location: Right Arm)   Pulse 91   Temp 98 F (36.7 C) (Oral)   Resp 18   Ht 5' 4 (1.626 m)   Wt 54.4 kg   LMP 03/31/1994   SpO2 99%   BMI 20.60 kg/m  Gen:   Awake, no distress   Resp:  Normal effort  MSK:   Moves extremities without difficulty  Other:    Medical Decision Making  Medically screening exam initiated at 12:28 PM.  Appropriate orders placed.  Susan Crosby was informed that the remainder of the evaluation will be completed by another provider, this initial triage assessment does not replace that evaluation, and the importance of remaining in the ED until their evaluation is complete.     Nivia Colon, PA-C 02/12/24 1229

## 2024-02-16 ENCOUNTER — Telehealth: Payer: Self-pay | Admitting: Emergency Medicine

## 2024-02-16 NOTE — Telephone Encounter (Signed)
 He asked for an appointment to be made for his wife- new patient. Wants her to be seen by same cardiologist as him. He reports that she had a syncopal event- along with jitteriness and tingling and went to the ER recently.  Self-referral made- given our fax number for medical records to be sent to our office= [phone].   Informed that it would be a good idea for them to keep a log of Blood pressures and blood sugars when she starts to feel jittery and/or tingly. He verbalized understanding.

## 2024-02-22 ENCOUNTER — Other Ambulatory Visit: Payer: Self-pay

## 2024-03-02 ENCOUNTER — Encounter: Payer: Self-pay | Admitting: Cardiology

## 2024-03-02 ENCOUNTER — Ambulatory Visit: Attending: Cardiology | Admitting: Cardiology

## 2024-03-02 ENCOUNTER — Ambulatory Visit

## 2024-03-02 VITALS — BP 113/73 | HR 68 | Ht 64.0 in | Wt 122.0 lb

## 2024-03-02 DIAGNOSIS — R55 Syncope and collapse: Secondary | ICD-10-CM | POA: Diagnosis not present

## 2024-03-02 NOTE — Progress Notes (Signed)
 Cardiology Office Note:  .   Date:  03/02/2024  ID:  Geraldene, Eisel 1955-11-11, MRN [id number] PCP: Charlott Dorn LABOR, MD  [facility] Providers Cardiologist:  None     History of Present Illness: .   Karynn Deblasi is a 68 y.o. female Discussed the use of AI scribe   History of Present Illness Chalese Wanita Derenzo is a 68 year old female who presents with syncope. She is accompanied by her husband, [place].  On February 12, 2024, she experienced a syncopal episode while bending over and then standing up abruptly. She felt like she was going to pass out, with peripheral vision becoming 'black' but maintaining central vision. Her husband assisted her to the floor, and she landed on her right side without head trauma.  In the emergency department, her EKG showed normal sinus rhythm with nonspecific STT wave changes, and her heart rate was 90 beats per minute. Her blood pressure was 125/52 mmHg. She received a one-liter normal saline bolus. Her total bilirubin was slightly elevated at 2.3, while troponin and creatinine levels were 3 and 0.88, respectively. Potassium was 3.5. X-rays of the right humerus and hip were unremarkable.  She has felt 'off' since the incident, with persistent symptoms of lightheadedness, jitteriness, and tingling into the following week. She has not experienced syncope before and is now nervous about walking distances, impacting her daily routine of walking over a mile a day.  Her husband noted she had not eaten or drunk anything before the episode, which occurred around noon. She has been increasing her fluid intake since then, drinking more than a liter of water daily and occasionally consuming ginger ale.  No crushing chest pain. She describes her vision as 'tubular' during the episode and did not experience room spinning, differentiating her symptoms from dizziness.      Studies Reviewed: SABRA   EKG Interpretation Date/Time:  Wednesday March 02 2024 09:52:52 EST Ventricular Rate:  68 PR Interval:  118 QRS Duration:  86 QT Interval:  404 QTC Calculation: 429 R Axis:   41  Text Interpretation: Normal sinus rhythm Poor R wave progression When compared with ECG of 12-Feb-2024 12:08, T wave inversion less evident in Inferior leads T wave inversion no longer evident in Lateral leads Confirmed by Jeffrie Anes ([id number]) on 03/02/2024 9:54:21 AM    Results LABS Total bilirubin: 2.3 (02/12/2024) Troponin: 3 (02/12/2024) Creatinine: 0.88 (02/12/2024) Potassium: 3.5 (02/12/2024)  RADIOLOGY X-ray right humerus and hip: Unremarkable (02/12/2024)  DIAGNOSTIC EKG: Normal sinus rhythm with nonspecific ST-T wave changes (02/12/2024) Risk Assessment/Calculations:            Physical Exam:   VS:  BP 113/73   Pulse 68   Ht 5' 4 (1.626 m)   Wt 122 lb (55.3 kg)   LMP 03/31/1994   PF 95 L/min   BMI 20.94 kg/m    Wt Readings from Last 3 Encounters:  03/02/24 122 lb (55.3 kg)  02/12/24 120 lb (54.4 kg)  12/04/22 136 lb 12.8 oz (62.1 kg)    GEN: Well nourished, well developed in no acute distress NECK: No JVD; No carotid bruits CARDIAC: RRR, no murmurs, no rubs, no gallops RESPIRATORY:  Clear to auscultation without rales, wheezing or rhonchi  ABDOMEN: Soft, non-tender, non-distended EXTREMITIES:  No edema; No deformity   ASSESSMENT AND PLAN: .    Assessment and Plan Assessment & Plan Syncope/ vasovagal syncope evaluation Syncope episode on December 3rd, 2025, characterized by prodromal symptoms such  as peripheral vision changes and lightheadedness, consistent with vasovagal syncope. EKG showed normal sinus rhythm with nonspecific STT wave changes, likely due to artifact. No evidence of myocardial infarction from troponin levels. Differential includes dehydration and anxiety as contributing factors. No prior history of syncope. Symptoms improved with hydration in the ER. - Repeated EKG to ensure clarity and rule out significant  abnormalities. - Ordered echocardiogram to assess heart function and rule out structural heart disease. - Ordered Zio monitor for two weeks to evaluate for arrhythmias. - Advised on maintaining hydration and increasing salt intake to manage potential dehydration and orthostatic hypotension.  Suspected orthostatic hypotension Contributing to syncope episode. Blood pressure readings show low diastolic values, possibly due to dehydration or low salt intake. Symptoms include lightheadedness upon standing, consistent with orthostatic hypotension. - Advised on increasing salt intake to help raise blood pressure. - Encouraged continued hydration with water and electrolyte solutions like Gatorade or liquid IV. - Discussed the use of supportive stockings to aid venous return and reduce orthostatic symptoms.  Anxiety related to syncope Anxiety related to the syncope episode, contributing to symptoms of lightheadedness and fear of future episodes. Anxiety may exacerbate symptoms through altered breathing patterns and increased tension. - Provided reassurance regarding the benign nature of vasovagal syncope.   ER notes, lab work, x-rays, EKGs personally reviewed      Dispo: We will follow-up with results of testing  Signed, Oneil Parchment, MD

## 2024-03-02 NOTE — Patient Instructions (Signed)
 Medication Instructions:  The current medical regimen is effective;  continue present plan and medications.  *If you need a refill on your cardiac medications before your next appointment, please call your pharmacy*  Testing/Procedures: Your physician has requested that you have an echocardiogram. Echocardiography is a painless test that uses sound waves to create images of your heart. It provides your doctor with information about the size and shape of your heart and how well your heart's chambers and valves are working. This procedure takes approximately one hour. There are no restrictions for this procedure. Please do NOT wear cologne, perfume, aftershave, or lotions (deodorant is allowed). Please arrive 15 minutes prior to your appointment time.  Please note: We ask at that you not bring children with you during ultrasound (echo/ vascular) testing. Due to room size and safety concerns, children are not allowed in the ultrasound rooms during exams. Our front office staff cannot provide observation of children in our lobby area while testing is being conducted. An adult accompanying a patient to their appointment will only be allowed in the ultrasound room at the discretion of the ultrasound technician under special circumstances. We apologize for any inconvenience.  ZIO XT- Long Term Monitor Instructions  Your physician has requested you wear a ZIO patch monitor for 14 days.  This is a single patch monitor. [employer] supplies one patch monitor per enrollment. Additional stickers are not available. Please do not apply patch if you will be having a Nuclear Stress Test,  Echocardiogram, Cardiac CT, MRI, or Chest Xray during the period you would be wearing the  monitor. The patch cannot be worn during these tests. You cannot remove and re-apply the  ZIO XT patch monitor.  Your ZIO patch monitor will be mailed 3 day USPS to your address on file. It may take 3-5 days  to receive your monitor after  you have been enrolled.  Once you have received your monitor, please review the enclosed instructions. Your monitor  has already been registered assigning a specific monitor serial # to you.  Billing and Patient Assistance Program Information  We have supplied [employer] with any of your insurance information on file for billing purposes. [employer] offers a sliding scale Patient Assistance Program for patients that do not have  insurance, or whose insurance does not completely cover the cost of the ZIO monitor.  You must apply for the Patient Assistance Program to qualify for this discounted rate.  To apply, please call [employer] at [phone], select option 4, select option 2, ask to apply for  Patient Assistance Program. Sanna Crystal will ask your household income, and how many people  are in your household. They will quote your out-of-pocket cost based on that information.  [employer] will also be able to set up a 37-month, interest-free payment plan if needed.  Applying the monitor   Shave hair from upper left chest.  Hold abrader disc by orange tab. Rub abrader in 40 strokes over the upper left chest as  indicated in your monitor instructions.  Clean area with 4 enclosed alcohol pads. Let dry.  Apply patch as indicated in monitor instructions. Patch will be placed under collarbone on left  side of chest with arrow pointing upward.  Rub patch adhesive wings for 2 minutes. Remove white label marked 1. Remove the white  label marked 2. Rub patch adhesive wings for 2 additional minutes.  While looking in a mirror, press and release button in center of patch. A small green light will  flash  3-4 times. This will be your only indicator that the monitor has been turned on.  Do not shower for the first 24 hours. You may shower after the first 24 hours.  Press the button if you feel a symptom. You will hear a small click. Record Date, Time and  Symptom in the Patient Logbook.  When you are ready to  remove the patch, follow instructions on the last 2 pages of Patient  Logbook. Stick patch monitor onto the last page of Patient Logbook.  Place Patient Logbook in the blue and white box. Use locking tab on box and tape box closed  securely. The blue and white box has prepaid postage on it. Please place it in the mailbox as  soon as possible. Your physician should have your test results approximately 7 days after the  monitor has been mailed back to [facility]  Call [facility] Customer Care at [phone] if you have questions regarding  your ZIO XT patch monitor. Call them immediately if you see an orange light blinking on your  monitor.  If your monitor falls off in less than 4 days, contact our Monitor department at [phone].  If your monitor becomes loose or falls off after 4 days call [employer] at [phone] for  suggestions on securing your monitor   Follow-Up: At [facility], you and your health needs are our priority.  As part of our continuing mission to provide you with exceptional heart care, our providers are all part of one team.  This team includes your primary Cardiologist (physician) and Advanced Practice Providers or APPs (Physician Assistants and Nurse Practitioners) who all work together to provide you with the care you need, when you need it.  Your next appointment:   Follow up will be based on the results of the above testing.   We recommend signing up for the patient portal called MyChart.  Sign up information is provided on this After Visit Summary.  MyChart is used to connect with patients for Virtual Visits (Telemedicine).  Patients are able to view lab/test results, encounter notes, upcoming appointments, etc.  Non-urgent messages can be sent to your provider as well.   To learn more about what you can do with MyChart, go to [email or web address].

## 2024-03-02 NOTE — Progress Notes (Unsigned)
 Enrolled for [employer] to mail a ZIO XT long term holter monitor to the patients address on file.

## 2024-03-07 ENCOUNTER — Other Ambulatory Visit (HOSPITAL_COMMUNITY): Payer: Self-pay

## 2024-03-29 ENCOUNTER — Other Ambulatory Visit (HOSPITAL_COMMUNITY): Payer: Self-pay

## 2024-03-29 MED ORDER — IMIQUIMOD 5 % EX CREA
1.0000 | TOPICAL_CREAM | Freq: Every evening | CUTANEOUS | 0 refills | Status: AC
  Filled 2024-03-29: qty 6, 14d supply, fill #0

## 2024-03-30 ENCOUNTER — Other Ambulatory Visit (HOSPITAL_COMMUNITY): Payer: Self-pay

## 2024-04-07 ENCOUNTER — Ambulatory Visit (HOSPITAL_COMMUNITY)
Admission: RE | Admit: 2024-04-07 | Discharge: 2024-04-07 | Disposition: A | Discharge status: Home / Self Care | Source: Ambulatory Visit | Attending: Cardiovascular Disease | Admitting: Cardiovascular Disease

## 2024-04-07 DIAGNOSIS — R55 Syncope and collapse: Secondary | ICD-10-CM | POA: Insufficient documentation

## 2024-04-07 LAB — ECHOCARDIOGRAM COMPLETE
Area-P 1/2: 3.45 cm2
S' Lateral: 2.7 cm

## 2024-04-08 ENCOUNTER — Ambulatory Visit: Payer: Self-pay | Admitting: Cardiology

## 2024-04-10 ENCOUNTER — Other Ambulatory Visit (HOSPITAL_COMMUNITY): Payer: Self-pay

## 2024-04-11 ENCOUNTER — Other Ambulatory Visit (HOSPITAL_COMMUNITY): Payer: Self-pay

## 2024-04-11 ENCOUNTER — Other Ambulatory Visit: Payer: Self-pay

## 2024-04-11 MED ORDER — LEVOCETIRIZINE DIHYDROCHLORIDE 5 MG PO TABS
5.0000 mg | ORAL_TABLET | Freq: Every evening | ORAL | 0 refills | Status: AC
  Filled 2024-04-11: qty 30, 30d supply, fill #0

## 2024-04-12 DIAGNOSIS — R55 Syncope and collapse: Secondary | ICD-10-CM
